# Patient Record
Sex: Male | Born: 1960
Health system: Southern US, Community
[De-identification: ages and names within clinical notes are randomized; demographics above are authoritative.]

## PROBLEM LIST (undated history)

## (undated) DIAGNOSIS — E7800 Pure hypercholesterolemia, unspecified: Secondary | ICD-10-CM

## (undated) DIAGNOSIS — I10 Essential (primary) hypertension: Secondary | ICD-10-CM

## (undated) DIAGNOSIS — E119 Type 2 diabetes mellitus without complications: Secondary | ICD-10-CM

## (undated) HISTORY — PX: BACK SURGERY: SHX140

---

## 2000-09-28 ENCOUNTER — Emergency Department (HOSPITAL_COMMUNITY): Admission: EM | Admit: 2000-09-28 | Discharge: 2000-09-29 | Payer: Self-pay | Admitting: Emergency Medicine

## 2002-07-05 ENCOUNTER — Emergency Department (HOSPITAL_COMMUNITY): Admission: EM | Admit: 2002-07-05 | Discharge: 2002-07-06 | Payer: Self-pay | Admitting: Emergency Medicine

## 2004-10-30 ENCOUNTER — Ambulatory Visit (HOSPITAL_COMMUNITY): Admission: RE | Admit: 2004-10-30 | Discharge: 2004-10-30 | Payer: Self-pay | Admitting: Family Medicine

## 2005-06-27 ENCOUNTER — Emergency Department (HOSPITAL_COMMUNITY): Admission: EM | Admit: 2005-06-27 | Discharge: 2005-06-27 | Payer: Self-pay | Admitting: Emergency Medicine

## 2007-03-31 ENCOUNTER — Ambulatory Visit (HOSPITAL_COMMUNITY): Admission: RE | Admit: 2007-03-31 | Discharge: 2007-03-31 | Payer: Self-pay | Admitting: *Deleted

## 2007-04-14 ENCOUNTER — Ambulatory Visit (HOSPITAL_COMMUNITY): Admission: RE | Admit: 2007-04-14 | Discharge: 2007-04-14 | Payer: Self-pay | Admitting: *Deleted

## 2011-02-25 ENCOUNTER — Other Ambulatory Visit: Payer: Self-pay

## 2011-02-25 ENCOUNTER — Telehealth: Payer: Self-pay

## 2011-02-25 DIAGNOSIS — Z139 Encounter for screening, unspecified: Secondary | ICD-10-CM

## 2011-02-25 NOTE — Telephone Encounter (Signed)
CONTINUE GLUCOPHAGE.  MOVI PREP SPLIT DOSING, REGULAR BREAKFAST. CLEAR LIQUIDS AFTER 9 AM.

## 2011-02-25 NOTE — Telephone Encounter (Signed)
Gastroenterology Pre-Procedure Form   Request Date: 02/25/2011        Requesting Physician: Dr. Sherwood Gambler     PATIENT INFORMATION:  Scott Griffin is a 51 y.o., male (DOB=[birth year]).  PROCEDURE: Procedure(s) requested: colonoscopy Procedure Reason: screening for colon cancer  PATIENT REVIEW QUESTIONS: The patient reports the following:   1. Diabetes Melitis: yes  2. Joint replacements in the past 12 months: no 3. Major health problems in the past 3 months: no 4. Has an artificial valve or MVP:no 5. Has been advised in past to take antibiotics in advance of a procedure like teeth cleaning: no}    MEDICATIONS & ALLERGIES:    Patient reports the following regarding taking any blood thinners:   Plavix? no Aspirin?yes  Coumadin?  no  Patient confirms/reports the following medications:  Current Outpatient Prescriptions  Medication Sig Dispense Refill  . amLODipine (NORVASC) 5 MG tablet Take 5 mg by mouth daily.      Marland Kitchen aspirin 325 MG tablet Take 325 mg by mouth daily.      . metFORMIN (GLUCOPHAGE) 500 MG tablet Take 500 mg by mouth 2 (two) times daily with a meal. Takes two tablets twice daily        Patient confirms/reports the following allergies:  No Known Allergies  Patient is appropriate to schedule for requested procedure(s): yes  AUTHORIZATION INFORMATION Primary Insurance:   ID #:   Group #:  Pre-Cert / Auth required:  Pre-Cert / Auth #:   Secondary Insurance:   ID #:   Group #:  Pre-Cert / Auth required Pre-Cert / Auth #:   No orders of the defined types were placed in this encounter.    SCHEDULE INFORMATION: Procedure has been scheduled as follows:  Date: 03/16/2011       Time: 8:15 AM  Location: [facility] Short Stay  This Gastroenterology Pre-Precedure Form is being routed to the following provider(s) for review: Jonette Eva, MD

## 2011-02-25 NOTE — Telephone Encounter (Signed)
LM for pt to call

## 2011-02-26 NOTE — Telephone Encounter (Signed)
Rx and instructions mailed to pt.  

## 2011-03-11 ENCOUNTER — Encounter (HOSPITAL_COMMUNITY): Payer: Self-pay | Admitting: Pharmacy Technician

## 2011-03-13 MED ORDER — SODIUM CHLORIDE 0.45 % IV SOLN: Freq: Once | INTRAVENOUS | Status: DC

## 2011-03-16 ENCOUNTER — Telehealth: Payer: Self-pay | Admitting: Gastroenterology

## 2011-03-16 NOTE — Telephone Encounter (Signed)
Pt called to reschedule his TCS-it was originally scheduled for 01/28- he asked to move it to 02/05- I called Kim Faint and left a message in regards to this

## 2011-03-24 ENCOUNTER — Ambulatory Visit (HOSPITAL_COMMUNITY)
Admission: RE | Admit: 2011-03-24 | Payer: No Typology Code available for payment source | Source: Ambulatory Visit | Admitting: Gastroenterology

## 2011-03-24 ENCOUNTER — Telehealth: Payer: Self-pay | Admitting: Gastroenterology

## 2011-03-24 ENCOUNTER — Encounter: Payer: Self-pay | Admitting: Gastroenterology

## 2011-03-24 ENCOUNTER — Encounter (HOSPITAL_COMMUNITY): Admission: RE | Payer: Self-pay | Source: Ambulatory Visit

## 2011-03-24 SURGERY — COLONOSCOPY
Anesthesia: Moderate Sedation

## 2011-03-24 NOTE — Telephone Encounter (Signed)
Call pt. He has failed to show for his TCS twice. He will need to be seen in the office to discuss his procedure prior to rescheduling.

## 2011-03-24 NOTE — Telephone Encounter (Signed)
Called multiple times, busy signal, will mail letter

## 2012-02-21 ENCOUNTER — Emergency Department (HOSPITAL_COMMUNITY): Payer: No Typology Code available for payment source

## 2012-02-21 ENCOUNTER — Encounter (HOSPITAL_COMMUNITY): Payer: Self-pay | Admitting: *Deleted

## 2012-02-21 ENCOUNTER — Other Ambulatory Visit: Payer: Self-pay

## 2012-02-21 ENCOUNTER — Emergency Department (HOSPITAL_COMMUNITY)
Admission: EM | Admit: 2012-02-21 | Discharge: 2012-02-21 | Disposition: A | Discharge status: Home / Self Care | Payer: No Typology Code available for payment source | Attending: Emergency Medicine | Admitting: Emergency Medicine

## 2012-02-21 DIAGNOSIS — R079 Chest pain, unspecified: Secondary | ICD-10-CM | POA: Insufficient documentation

## 2012-02-21 DIAGNOSIS — Z7982 Long term (current) use of aspirin: Secondary | ICD-10-CM | POA: Insufficient documentation

## 2012-02-21 DIAGNOSIS — E119 Type 2 diabetes mellitus without complications: Secondary | ICD-10-CM | POA: Insufficient documentation

## 2012-02-21 DIAGNOSIS — F17200 Nicotine dependence, unspecified, uncomplicated: Secondary | ICD-10-CM | POA: Insufficient documentation

## 2012-02-21 DIAGNOSIS — Z794 Long term (current) use of insulin: Secondary | ICD-10-CM | POA: Insufficient documentation

## 2012-02-21 DIAGNOSIS — I10 Essential (primary) hypertension: Secondary | ICD-10-CM | POA: Insufficient documentation

## 2012-02-21 DIAGNOSIS — E7800 Pure hypercholesterolemia, unspecified: Secondary | ICD-10-CM | POA: Insufficient documentation

## 2012-02-21 DIAGNOSIS — Z79899 Other long term (current) drug therapy: Secondary | ICD-10-CM | POA: Insufficient documentation

## 2012-02-21 HISTORY — DX: Type 2 diabetes mellitus without complications: E11.9

## 2012-02-21 HISTORY — DX: Pure hypercholesterolemia, unspecified: E78.00

## 2012-02-21 HISTORY — DX: Essential (primary) hypertension: I10

## 2012-02-21 LAB — CBC
HCT: 45.7 % (ref 39.0–52.0)
MCV: 82.8 fL (ref 78.0–100.0)
RBC: 5.52 MIL/uL (ref 4.22–5.81)
RDW: 13.3 % (ref 11.5–15.5)
WBC: 8.3 10*3/uL (ref 4.0–10.5)

## 2012-02-21 LAB — BASIC METABOLIC PANEL
BUN: 11 mg/dL (ref 6–23)
CO2: 25 mEq/L (ref 19–32)
Chloride: 100 mEq/L (ref 96–112)
Creatinine, Ser: 1.03 mg/dL (ref 0.50–1.35)
Glucose, Bld: 144 mg/dL — ABNORMAL HIGH (ref 70–99)

## 2012-02-21 NOTE — ED Notes (Signed)
Left sided cp x 2 wks with intermittent sob and n/v only in mornings.  Reports feels heart flutter at times.  NSR on ECG in triage.

## 2012-02-21 NOTE — ED Provider Notes (Signed)
History     CSN: [id number]  Arrival date & time 02/21/12  1123   First MD Initiated Contact with Patient 02/21/12 1329      Chief Complaint  Patient presents with  . Chest Pain    (Consider location/radiation/quality/duration/timing/severity/associated sxs/prior treatment) Patient is a 52 y.o. male presenting with chest pain. The history is provided by the patient (the pt complains of mild chest pain off and on. ). No language interpreter was used.  Chest Pain The chest pain began less than 1 hour ago. Chest pain occurs frequently. The chest pain is unchanged. Associated with: nothing. At its most intense, the pain is at 4/10. The pain is currently at 0/10. The severity of the pain is moderate. The quality of the pain is described as aching. The pain does not radiate. Exacerbated by: nothing. Pertinent negatives for primary symptoms include no fatigue, no cough and no abdominal pain.  Pertinent negatives for past medical history include no seizures.     Past Medical History  Diagnosis Date  . Diabetes mellitus without complication   . Hypertension   . High cholesterol     Past Surgical History  Procedure Date  . Back surgery     No family history on file.  History  Substance Use Topics  . Smoking status: Current Every Day Smoker    Types: Cigarettes  . Smokeless tobacco: Not on file  . Alcohol Use: No      Review of Systems  Constitutional: Negative for fatigue.  HENT: Negative for congestion, sinus pressure and ear discharge.   Eyes: Negative for discharge.  Respiratory: Negative for cough.   Cardiovascular: Positive for chest pain.  Gastrointestinal: Negative for abdominal pain and diarrhea.  Genitourinary: Negative for frequency and hematuria.  Musculoskeletal: Negative for back pain.  Skin: Negative for rash.  Neurological: Negative for seizures and headaches.  Hematological: Negative.   Psychiatric/Behavioral: Negative for hallucinations.    Allergies   Review of patient's allergies indicates no known allergies.  Home Medications   Current Outpatient Rx  Name  Route  Sig  Dispense  Refill  . AMLODIPINE BESYLATE 5 MG PO TABS   Oral   Take 5 mg by mouth daily.         . ASPIRIN 325 MG PO TABS   Oral   Take 325 mg by mouth daily.         Marland Kitchen BENAZEPRIL HCL 40 MG PO TABS   Oral   Take 40 mg by mouth daily.         . INSULIN GLARGINE 100 UNIT/ML [place] SOLN   Subcutaneous   Inject 20 Units into the skin at bedtime.         Marland Kitchen METFORMIN HCL 500 MG PO TABS   Oral   Take 1,000 mg by mouth 2 (two) times daily with a meal. Takes two tablets twice daily         . SIMVASTATIN 20 MG PO TABS   Oral   Take 20 mg by mouth every evening.           BP 121/79  Pulse 79  Temp 98.1 F (36.7 C) (Oral)  Resp 21  Ht 5\' 8"  (1.727 m)  Wt 208 lb (94.348 kg)  BMI 31.63 kg/m2  SpO2 97%  Physical Exam  Constitutional: He is oriented to person, place, and time. He appears well-developed.  HENT:  Head: Normocephalic and atraumatic.  Eyes: Conjunctivae normal and EOM are normal. No scleral icterus.  Neck: Neck supple. No thyromegaly present.  Cardiovascular: Normal rate and regular rhythm.  Exam reveals no gallop and no friction rub.   No murmur heard. Pulmonary/Chest: No stridor. He has no wheezes. He has no rales. He exhibits no tenderness.  Abdominal: He exhibits no distension. There is no tenderness. There is no rebound.  Musculoskeletal: Normal range of motion. He exhibits no edema.  Lymphadenopathy:    He has no cervical adenopathy.  Neurological: He is oriented to person, place, and time. Coordination normal.  Skin: No rash noted. No erythema.  Psychiatric: He has a normal mood and affect. His behavior is normal.    ED Course  Procedures (including critical care time)  Labs Reviewed  BASIC METABOLIC PANEL - Abnormal; Notable for the following:    Sodium 134 (*)     Glucose, Bld 144 (*)     GFR calc non Af Amer 81 (*)      All other components within normal limits  CBC  TROPONIN I  TROPONIN I   Dg Chest Port 1 View  02/21/2012  *RADIOLOGY REPORT*  Clinical Data: Chest pain  PORTABLE CHEST - 1 VIEW  Comparison: 04/14/2007  Findings: Lung volumes are low with crowding of the bronchovascular markings and bibasilar linear opacities, likely atelectasis.  No pleural effusion.  Heart size is normal.  IMPRESSION: Low volumes with linear bibasilar opacities, likely atelectasis. This could obscure early pneumonia. If the patient's symptoms continue, consider PA and lateral chest radiographs obtained at full inspiration when the patient is clinically able.   Original Report Authenticated By: Christiana Pellant, M.D.      1. Chest pain     Date: 02/21/2012  Rate:79  Rhythm: normal sinus rhythm  QRS Axis: normal  Intervals: normal  ST/T Wave abnormalities: normal  Conduction Disutrbances:none  Narrative Interpretation:   Old EKG Reviewed: none available     MDM          Benny Lennert, MD 02/21/12 [phone]

## 2013-04-20 DIAGNOSIS — I10 Essential (primary) hypertension: Secondary | ICD-10-CM | POA: Diagnosis not present

## 2013-04-20 DIAGNOSIS — Z125 Encounter for screening for malignant neoplasm of prostate: Secondary | ICD-10-CM | POA: Diagnosis not present

## 2013-04-20 DIAGNOSIS — Z6829 Body mass index (BMI) 29.0-29.9, adult: Secondary | ICD-10-CM | POA: Diagnosis not present

## 2013-04-20 DIAGNOSIS — IMO0001 Reserved for inherently not codable concepts without codable children: Secondary | ICD-10-CM | POA: Diagnosis not present

## 2013-04-20 DIAGNOSIS — E119 Type 2 diabetes mellitus without complications: Secondary | ICD-10-CM | POA: Diagnosis not present

## 2013-04-20 DIAGNOSIS — E785 Hyperlipidemia, unspecified: Secondary | ICD-10-CM | POA: Diagnosis not present

## 2013-04-20 DIAGNOSIS — Z719 Counseling, unspecified: Secondary | ICD-10-CM | POA: Diagnosis not present

## 2014-01-05 ENCOUNTER — Emergency Department (HOSPITAL_COMMUNITY): Payer: Medicare Other

## 2014-01-05 ENCOUNTER — Emergency Department (HOSPITAL_COMMUNITY)
Admission: EM | Admit: 2014-01-05 | Discharge: 2014-01-05 | Disposition: A | Discharge status: Home / Self Care | Payer: Medicare Other | Attending: Emergency Medicine | Admitting: Emergency Medicine

## 2014-01-05 ENCOUNTER — Encounter (HOSPITAL_COMMUNITY): Payer: Self-pay | Admitting: *Deleted

## 2014-01-05 DIAGNOSIS — I10 Essential (primary) hypertension: Secondary | ICD-10-CM | POA: Diagnosis not present

## 2014-01-05 DIAGNOSIS — Y998 Other external cause status: Secondary | ICD-10-CM | POA: Diagnosis not present

## 2014-01-05 DIAGNOSIS — E119 Type 2 diabetes mellitus without complications: Secondary | ICD-10-CM | POA: Insufficient documentation

## 2014-01-05 DIAGNOSIS — W11XXXA Fall on and from ladder, initial encounter: Secondary | ICD-10-CM | POA: Diagnosis not present

## 2014-01-05 DIAGNOSIS — R202 Paresthesia of skin: Secondary | ICD-10-CM

## 2014-01-05 DIAGNOSIS — S62032A Displaced fracture of proximal third of navicular [scaphoid] bone of left wrist, initial encounter for closed fracture: Secondary | ICD-10-CM | POA: Insufficient documentation

## 2014-01-05 DIAGNOSIS — S128XXA Fracture of other parts of neck, initial encounter: Secondary | ICD-10-CM | POA: Diagnosis not present

## 2014-01-05 DIAGNOSIS — W19XXXA Unspecified fall, initial encounter: Secondary | ICD-10-CM

## 2014-01-05 DIAGNOSIS — M542 Cervicalgia: Secondary | ICD-10-CM | POA: Diagnosis not present

## 2014-01-05 DIAGNOSIS — M546 Pain in thoracic spine: Secondary | ICD-10-CM | POA: Diagnosis not present

## 2014-01-05 DIAGNOSIS — S62002A Unspecified fracture of navicular [scaphoid] bone of left wrist, initial encounter for closed fracture: Secondary | ICD-10-CM

## 2014-01-05 DIAGNOSIS — S199XXA Unspecified injury of neck, initial encounter: Secondary | ICD-10-CM | POA: Diagnosis not present

## 2014-01-05 DIAGNOSIS — S3992XA Unspecified injury of lower back, initial encounter: Secondary | ICD-10-CM | POA: Diagnosis not present

## 2014-01-05 DIAGNOSIS — Y9389 Activity, other specified: Secondary | ICD-10-CM | POA: Diagnosis not present

## 2014-01-05 DIAGNOSIS — Y929 Unspecified place or not applicable: Secondary | ICD-10-CM | POA: Insufficient documentation

## 2014-01-05 DIAGNOSIS — Z23 Encounter for immunization: Secondary | ICD-10-CM | POA: Diagnosis not present

## 2014-01-05 DIAGNOSIS — Z720 Tobacco use: Secondary | ICD-10-CM | POA: Diagnosis not present

## 2014-01-05 DIAGNOSIS — M25532 Pain in left wrist: Secondary | ICD-10-CM

## 2014-01-05 DIAGNOSIS — S01511A Laceration without foreign body of lip, initial encounter: Secondary | ICD-10-CM

## 2014-01-05 DIAGNOSIS — S3991XA Unspecified injury of abdomen, initial encounter: Secondary | ICD-10-CM | POA: Diagnosis not present

## 2014-01-05 DIAGNOSIS — S299XXA Unspecified injury of thorax, initial encounter: Secondary | ICD-10-CM | POA: Diagnosis not present

## 2014-01-05 DIAGNOSIS — S6992XA Unspecified injury of left wrist, hand and finger(s), initial encounter: Secondary | ICD-10-CM | POA: Insufficient documentation

## 2014-01-05 DIAGNOSIS — S0990XA Unspecified injury of head, initial encounter: Secondary | ICD-10-CM | POA: Diagnosis not present

## 2014-01-05 DIAGNOSIS — R079 Chest pain, unspecified: Secondary | ICD-10-CM

## 2014-01-05 DIAGNOSIS — S0993XA Unspecified injury of face, initial encounter: Secondary | ICD-10-CM | POA: Diagnosis not present

## 2014-01-05 DIAGNOSIS — S24109A Unspecified injury at unspecified level of thoracic spinal cord, initial encounter: Secondary | ICD-10-CM | POA: Diagnosis not present

## 2014-01-05 DIAGNOSIS — S3993XA Unspecified injury of pelvis, initial encounter: Secondary | ICD-10-CM | POA: Diagnosis not present

## 2014-01-05 LAB — CBC WITH DIFFERENTIAL/PLATELET
BASOS ABS: 0 10*3/uL (ref 0.0–0.1)
Basophils Relative: 0 % (ref 0–1)
EOS PCT: 1 % (ref 0–5)
Eosinophils Absolute: 0.1 10*3/uL (ref 0.0–0.7)
HCT: 46 % (ref 39.0–52.0)
Hemoglobin: 15.7 g/dL (ref 13.0–17.0)
LYMPHS PCT: 24 % (ref 12–46)
Lymphs Abs: 2.2 10*3/uL (ref 0.7–4.0)
MCH: 28.6 pg (ref 26.0–34.0)
MCHC: 34.1 g/dL (ref 30.0–36.0)
MCV: 83.8 fL (ref 78.0–100.0)
Monocytes Absolute: 0.7 10*3/uL (ref 0.1–1.0)
Monocytes Relative: 8 % (ref 3–12)
NEUTROS ABS: 6.1 10*3/uL (ref 1.7–7.7)
NEUTROS PCT: 67 % (ref 43–77)
PLATELETS: 200 10*3/uL (ref 150–400)
RBC: 5.49 MIL/uL (ref 4.22–5.81)
RDW: 13 % (ref 11.5–15.5)
WBC: 9.2 10*3/uL (ref 4.0–10.5)

## 2014-01-05 LAB — I-STAT CHEM 8, ED
BUN: 7 mg/dL (ref 6–23)
CALCIUM ION: 1.17 mmol/L (ref 1.12–1.23)
Chloride: 104 mEq/L (ref 96–112)
Creatinine, Ser: 0.9 mg/dL (ref 0.50–1.35)
Glucose, Bld: 153 mg/dL — ABNORMAL HIGH (ref 70–99)
HEMATOCRIT: 50 % (ref 39.0–52.0)
HEMOGLOBIN: 17 g/dL (ref 13.0–17.0)
Potassium: 3.9 mEq/L (ref 3.7–5.3)
SODIUM: 138 meq/L (ref 137–147)
TCO2: 22 mmol/L (ref 0–100)

## 2014-01-05 MED ORDER — FENTANYL CITRATE 0.05 MG/ML IJ SOLN
25.0000 ug | INTRAMUSCULAR | Status: AC | PRN
  Administered 2014-01-05 (×2): 25 ug via INTRAVENOUS
  Filled 2014-01-05 (×2): qty 2

## 2014-01-05 MED ORDER — METHYLPREDNISOLONE 4 MG PO KIT: PACK | ORAL | Status: DC

## 2014-01-05 MED ORDER — IOHEXOL 300 MG/ML  SOLN
100.0000 mL | Freq: Once | INTRAMUSCULAR | Status: AC | PRN
  Administered 2014-01-05: 100 mL via INTRAVENOUS

## 2014-01-05 MED ORDER — FENTANYL CITRATE 0.05 MG/ML IJ SOLN
100.0000 ug | INTRAMUSCULAR | Status: AC | PRN
  Administered 2014-01-05 (×2): 100 ug via INTRAVENOUS
  Filled 2014-01-05 (×2): qty 2

## 2014-01-05 MED ORDER — SODIUM CHLORIDE 0.9 % IV SOLN
INTRAVENOUS | Status: DC
  Administered 2014-01-05: 12:00:00 via INTRAVENOUS

## 2014-01-05 MED ORDER — POVIDONE-IODINE 10 % EX SOLN
CUTANEOUS | Status: AC
  Filled 2014-01-05: qty 118

## 2014-01-05 MED ORDER — TETANUS-DIPHTH-ACELL PERTUSSIS 5-2.5-18.5 LF-MCG/0.5 IM SUSP
0.5000 mL | Freq: Once | INTRAMUSCULAR | Status: AC
  Administered 2014-01-05: 0.5 mL via INTRAMUSCULAR
  Filled 2014-01-05: qty 0.5

## 2014-01-05 MED ORDER — METHOCARBAMOL 500 MG PO TABS: 1000.0000 mg | ORAL_TABLET | Freq: Four times a day (QID) | ORAL | Status: DC | PRN

## 2014-01-05 MED ORDER — LIDOCAINE HCL (PF) 2 % IJ SOLN
INTRAMUSCULAR | Status: AC
  Filled 2014-01-05: qty 10

## 2014-01-05 MED ORDER — OXYCODONE-ACETAMINOPHEN 5-325 MG PO TABS: ORAL_TABLET | ORAL | Status: DC

## 2014-01-05 NOTE — Discharge Instructions (Signed)
°Emergency Department Resource Guide °1) Find a Doctor and Pay Out of Pocket °Although you won't have to find out who is covered by your insurance plan, it is a good idea to ask around and get recommendations. You will then need to call the office and see if the doctor you have chosen will accept you as a new patient and what types of options they offer for patients who are self-pay. Some doctors offer discounts or will set up payment plans for their patients who do not have insurance, but you will need to ask so you aren't surprised when you get to your appointment. ° °2) Contact Your Local Health Department °Not all health departments have doctors that can see patients for sick visits, but many do, so it is worth a call to see if yours does. If you don't know where your local health department is, you can check in your phone book. The CDC also has a tool to help you locate your state's health department, and many state websites also have listings of all of their local health departments. ° °3) Find a Walk-in Clinic °If your illness is not likely to be very severe or complicated, you may want to try a walk in clinic. These are popping up all over the country in pharmacies, drugstores, and shopping centers. They're usually staffed by nurse practitioners or physician assistants that have been trained to treat common illnesses and complaints. They're usually fairly quick and inexpensive. However, if you have serious medical issues or chronic medical problems, these are probably not your best option. ° °No Primary Care Doctor: °- Call Health Connect at  832-8000 - they can help you locate a primary care doctor that  accepts your insurance, provides certain services, etc. °- Physician Referral Service- 1-800-533-3463 ° °Chronic Pain Problems: °Organization         Address  Phone   Notes  °[facility]  (336) 297-2271 Patients need to be referred by their primary care doctor.  ° °Medication  Assistance: °Organization         Address  Phone   Notes  °[place] [facility] [address] °[place], [place] [address] (336) 641-8030 --Must be a resident of [place] °-- Must have NO insurance coverage whatsoever (no Medicaid/ Medicare, etc.) °-- The pt. MUST have a primary care doctor that directs their care regularly and follows them in the community °  °[facility]  (866) 331-1348   °[employer]  (888) 892-1162   ° °Agencies that provide inexpensive medical care: °Organization         Address  Phone   Notes  °[facility]  (336) 832-8035   °[facility]    (336) 832-7272   °[facility] [address] °[place], [place] [address] (336) 832-4777   °[facility] [address], °[place] (336) 271-4999   °[facility]    (336) 373-0678   °[facility]    (336) 272-1050   °[facility] ° [address], [place] Phone:  (336) 832-4444, Fax:  (336) 832-4440 Hours of Operation:  9 am - 6 pm, M-F.  Also accepts Medicaid/Medicare and self-pay.  °[facility] ° [address], [place] Phone: (336) 832-3150, Fax: (336) [phone]. Hours of Operation:  8:30 am - 5:30 pm, M-F.  Also accepts Medicaid and self-pay.  °[facility] [place] [address], [place] Phone: (336) 878-6027   °[facility] [address], [place], [place] (336)723-1848, Ext. 123 Mondays & Thursdays: 7-9 AM.  First 15 patients are seen on a first come, first serve basis. °  ° °Medicaid-accepting [place] Providers: ° °Organization         Address  Phone   Notes  °[facility] [address], [place] (336) 641-2100 Also accepts self-pay patients.  °[facility] [address], [place] ° (336) 856-9996   °[facility] [address], [place]  (336) 288-8857   °[facility] [address] [place] [address], [place] (336) 299-7000   °Veita Bland [address], [place]  ° (336) 373-1557 Only accepts [place] Access Medicaid patients after they have their name applied to their card.  ° °Self-Pay (no insurance) in [place]: ° °Organization         Address  Phone   Notes  °Sickle Cell Patients, [facility] [address], [place] (336) 832-1970   °[facility] [address], [place] (336) 832-4400   °[facility] ° [address], [place] (336) 992-4800   °[facility]/Dr. Osei-Bonsu ° [address] [place] [address], [place] or [address], [place] (336) 841-8500 Phone number for both [place] and [place] locations is the same.  °[facility] [address], [place] (336) 299-0000   °[facility] [address] [place] [address], [place] or [address] (336) 852-7530 °(336) 878-2260   °[facility] [address], [place] (336) 350-1642, phone; (336) 294-5005, fax Sees patients 1st and 3rd Saturday of every month.  Must not qualify for public or private insurance (i.e. Medicaid, Medicare, [place] Health Choice, Veterans' Benefits) • Household income should be no more than 200% of the poverty level •The clinic cannot treat you if you are pregnant or think you are pregnant • Sexually transmitted diseases are not treated at the clinic.  ° ° °Dental Care: °Organization         Address  Phone  Notes  °[place] [facility] [address], [place] (336) 641-6152 Accepts children up to age 21 who are enrolled in Medicaid or [place] Health Choice; pregnant women with a Medicaid card; and children who have applied for Medicaid or [place] Health Choice, but were declined, whose parents can pay a reduced fee at time of service.  °[place]  [facility] [place]  [address], [place] (336) 641-7733 Accepts children up to age 21 who are enrolled in Medicaid or [place] Health Choice; pregnant women with a Medicaid card; and children who have applied for Medicaid or [place] Health Choice, but were declined, whose parents can pay a reduced fee at time of service.  °[facility] ° [address], [place] (336) 641-4533 Patients are seen by appointment only. Walk-ins are not accepted. [facility] will see patients 18 years of age and older. °Monday - Tuesday (8am-5pm) °Most Wednesdays (8:30-5pm) °$30 per visit, cash only  °[facility] ° [address], [place] (336) 641-4533 Patients are seen by appointment only. Walk-ins are not accepted. [facility] will see patients 18 years of age and older. °One   Wednesday Evening (Monthly: Volunteer Based).  $30 per visit, cash only  °[facility]  (919) 537-3737 for adults; Children under age 4, call Graduate Pediatric Dentistry at (919) [phone]. Children aged 4-14, please call (919) [phone] to request a pediatric application. ° Dental services are provided in all areas of dental care including fillings, crowns and bridges, complete and partial dentures, implants, gum treatment, root canals, and extractions. Preventive care is also provided. Treatment is provided to both adults and children. °Patients are selected via a lottery and there is often a waiting list. °  °[facility] [address], °[place] ° (336) 763-8833 [email or web address] °  °[facility] [address], [place], [place] (336)723-1848, Ext. 123 Second and Fourth Thursday of each month, opens at 6:30 AM; Clinic ends at 9 AM.  Patients are seen on a first-come first-served basis, and a limited number are seen during each clinic.  ° °[facility] ° [address], [place], [place] (336) 723-7904    Eligibility Requirements °You must have lived in [place], [place], or [place] counties for at least the last three months. °  You cannot be eligible for state or federal sponsored healthcare insurance, including Veterans Administration, Medicaid, or Medicare. °  You generally cannot be eligible for healthcare insurance through your employer.  °  How to apply: °Eligibility screenings are held every Tuesday and Wednesday afternoon from 1:00 pm until 4:00 pm. You do not need an appointment for the interview!  °[facility] [address], [place], [place] 336-631-2330   °[facility]  336-342-8273   °[facility]  336-703-3100   °[facility]  336-570-6415   ° °Behavioral Health Resources in the Community: °Intensive Outpatient Programs °Organization         Address  Phone  Notes  °[facility] [address], [place], [place] 336-878-6098   °[facility] [address], [place], [place] 336-832-9800   °[facility] [address], [place], [place] ° 336-882-2125   °[facility] [address],  °[place], [place] 1-800-853-5163 or 336-641-4981   °Substance Abuse Resources °Organization         Address  Phone  Notes  °[facility]  336-882-2125   °[facility]  336-784-9470   °[facility]  336-285-9073   °[facility]  336-845-3988   °Residential & Outpatient Substance Abuse Program  1-800-659-3381   °Psychological Services °Organization         Address  Phone  Notes  °[facility]  336- 832-9600   °[facility]  336- 378-7881   °[facility] [address], [place] 1-800-853-5163 or 336-641-4981   ° °Mobile Crisis Teams °Organization         Address  Phone  Notes  °[facility], Mobile Crisis Care Unit  1-877-626-1772   °Assertive °[facility] ° [address]  [place], [place] 336-834-9664   °Sharon DeEsch [address] °[place] 336-554-5454   ° °Self-Help/Support Groups °Organization         Address  Phone             Notes  °[facility] of  - variety of support groups  336- 373-1402 Call for more information  °Narcotics Anonymous (NA), [facility] [address], °[place]  2 meetings at this location  ° °  Residential Treatment Programs Organization         Address  Phone  Notes  [facility] [address],    [place]  [phone]   [facility]  [address], [place] [phone], [place], [place] [phone]   [facility] [address], [place] [phone](564) [phone] Admissions: 8am-3pm M-F  [facility] [address]    [place], [place] [phone]   [facility] [address], [place], [place] [phone]   [facility] [address]  [place], [place] [phone](938)[phone]   [facility] Intensive Outpatient [address], [place], [place] [phone]   [facility] ([facility] [address]  [place], [place] [phone] or [phone]   [facility] ([facility]) [address] [place], [place] [phone] Accepts Medicaid  [facility] [address]  [place] [phone] Substance Abuse/Addiction Treatment   [facility] Organization         Address  Phone  Notes  [facility]  (661) 635-8330(888) [phone]   Angie FavaJulie Brannon, PhD [address] [place], [place]   (640)[phone](336) [phone] or [phone](336) [phone]   [facility]   [address] [place], [place] [phone](336) (337) 828-8300   [facility] [address], [place], [place] [phone](336) [phone] Insurance/Medicaid/sponsorship through [facility]  [facility] [address]                                    [place], [place] [phone](336) [phone] Therapy/tele-psych/case    [facility] [address]   [place], [place] [phone](336) [phone]    Dr. Lolly MustacheArfeen  [phone](336) [phone]   [facility] [place]  [employer] [facility] 1) [address], [place] 2) [address], [place] 3)  [address], [place] [phone](336) [phone]) [phone](336) [phone]  [phone](336) (747) [phone]   [facility] Child Abuse Hotline 229-189-4726(336) (873)230-2878 or 252-808-7398(336) 250-350-1759 (After Hours)       Take the prescriptions as directed. The stitches in your mouth will dissolve on their own. Wear the wrist splint at all times and use the sling as needed for comfort until you are seen in follow up by the Orthopedic doctor. Wear the cervical collar at all times until you are seen in follow up by the Neurosurgeon. Apply moist heat or ice to the area(s) of discomfort, for 15 minutes at a time, several times per day for the next few days.  Do not fall asleep on a heating or ice pack.  Call your regular medical doctor on Monday to schedule a follow up appointment in the next 3 days. Call the Neurosurgeon on Monday to schedule a follow up appointment within the next week. Call the Orthopedic doctor on Monday to schedule a follow up appointment within the next week. Return to the Emergency Department immediately if worsening.

## 2014-01-05 NOTE — ED Provider Notes (Signed)
CSN: [id number]     Arrival date & time 01/05/14  1106 History   First MD Initiated Contact with Patient 01/05/14 1151     Chief Complaint  Patient presents with  . Fall      HPI Pt was seen at 1155. Per pt and his family, c/o sudden onset and resolution of one episode of slip and fall off a 6 foot ladder this morning PTA. Pt states he was getting down from the roof after cleaning the gutters when the ladder became off balance and he fell face first onto the ground. Pt states he landed with the bottom half of his body on the porch and the top half of his body on the ground, face down. Pt states he initially felt "tingling" of his bilat UE's and LLE, so he "just laid there a while." Pt states his LLE tingling improved and he was able to stand on his own and walk into the house. Pt states his leg continues improved but his UE's continue to "tingle." Pt also c/o neck pain and lower lip laceration. Pt states his neck pain worsens when he flexes his arms forward. Denies low back pain, no CP/SOB, no abd pain, no focal motor weakness, no syncope/LOC, no AMS.    Past Medical History  Diagnosis Date  . Diabetes mellitus without complication   . Hypertension   . High cholesterol    Past Surgical History  Procedure Laterality Date  . Back surgery      History  Substance Use Topics  . Smoking status: Current Every Day Smoker    Types: Cigarettes  . Smokeless tobacco: Not on file  . Alcohol Use: No    Review of Systems ROS: Statement: All systems negative except as marked or noted in the HPI; Constitutional: Negative for fever and chills. ; ; Eyes: Negative for eye pain, redness and discharge. ; ; ENMT: Negative for ear pain, hoarseness, nasal congestion, sinus pressure and sore throat. ; ; Cardiovascular: Negative for chest pain, palpitations, diaphoresis, dyspnea and peripheral edema. ; ; Respiratory: Negative for cough, wheezing and stridor. ; ; Gastrointestinal: Negative for nausea,  vomiting, diarrhea, abdominal pain, blood in stool, hematemesis, jaundice and rectal bleeding. ; ; Genitourinary: Negative for dysuria, flank pain and hematuria. ; ; Musculoskeletal: +neck pain. Negative for back pain. Negative for swelling and deformity.; ; Skin: +laceration. Negative for pruritus, rash, abrasions, blisters, bruising and skin lesion.; ; Neuro: +paresthesias. Negative for headache, lightheadedness and neck stiffness. Negative for weakness, altered level of consciousness , altered mental status, extremity weakness, involuntary movement, seizure and syncope.      Allergies  Review of patient's allergies indicates no known allergies.  Home Medications   Prior to Admission medications   Medication Sig Start Date End Date Taking? Authorizing Provider  amLODipine (NORVASC) 5 MG tablet Take 5 mg by mouth daily.   Yes Historical Provider, MD  aspirin 325 MG tablet Take 325 mg by mouth daily.   Yes Historical Provider, MD  benazepril (LOTENSIN) 40 MG tablet Take 40 mg by mouth daily.   Yes Historical Provider, MD  Insulin Detemir (LEVEMIR FLEXPEN) 100 UNIT/ML Pen Inject 20 Units into the skin daily at 10 pm.   Yes Historical Provider, MD  metFORMIN (GLUCOPHAGE) 500 MG tablet Take 1,000 mg by mouth 2 (two) times daily with a meal. Takes two tablets twice daily   Yes Historical Provider, MD   BP 135/92 mmHg  Pulse 77  Temp(Src) 98.6 F (37 C) (Oral)  Resp 18  Ht 5\' 8"  (1.727 m)  Wt 208 lb (94.348 kg)  BMI 31.63 kg/m2  SpO2 98% Physical Exam  1200: Physical examination: Vital signs and O2 SAT: Reviewed; Constitutional: Well developed, Well nourished, Well hydrated, In no acute distress; Head and Face: Normocephalic, Atraumatic; Eyes: EOMI, PERRL, No scleral icterus; ENMT: Mouth and pharynx normal, Left TM normal, Right TM normal, Mucous membranes moist. +inner right lower lip lac, hemostatic, not through and through, no obvious FB. Lower teeth intact, edentulous upper. Mouth and  pharynx without lesions. No tonsillar exudates. No intra-oral edema. No submandibular or sublingual edema. No hoarse voice, no drooling, no stridor. No trismus.;; Neck: Trachea midline. No abrasions or ecchymosis.; Spine: +midline CS TTP. No midline TS, LS tenderness. +TTP bilat cervical paraspinal and bilat hypertonic trapezius muscles.  No abrasions or ecchymosis.; Cardiovascular: Regular rate and rhythm, No gallop; Respiratory: Breath sounds clear & equal bilaterally, No wheezes, Normal respiratory effort/excursion; Chest: Nontender, No deformity, Movement normal, No crepitus, No abrasions or ecchymosis.; Abdomen: Soft, Nontender, Nondistended, Normal bowel sounds, No abrasions or ecchymosis.; Genitourinary: No CVA tenderness;; Extremities: No deformity, Full range of motion major/large joints of bilat UE's and LE's without pain or tenderness to palp, Neurovascularly intact, Pulses normal, No tenderness, No edema, Pelvis stable. NT to palp entire bilat UE's. Motor strength at shoulder normal.  Bilateral hands having intact and equal strength in the distribution of the median, radial, and ulnar nerve function compared to opposite side.  Strong radial pulse.; Neuro: AA&Ox3, GCS 15.  Major CN grossly intact. No facial droop. Speech clear. Grips equal Strength 5/5 equal bilat UE's and LE's. No drift x4 extremities. +subjective but equal "different" sensation bilat UE's.; Skin: Color normal, Warm, Dry.     ED Course  Procedures   1200:  Pt arrived to ED by POV. C-collar applied by Triage RN given pt's c/o bilat UE's paresthesias. Neuro exam with bilat UE's subjectively "feeling funny" but pt unable to qualify further. No motor deficit, though pt does c/o increasing pain with bilat shoulder flexion. Will maintain c-collar while workup progresses.   1500:  Lip laceration closed by midlevel. CT CS with central soft disc protrusion at C4-5; MRI CS pending. C-collar maintained. No change in neuro exam. Pt has  climbed off the stretcher on his own and is walking around his exam room, gait steady, easy resps, NAD.   1540:  Pt now c/o left wrist pain:  Localized mild edema and mild generalized TTP on exam, no specific area of tenderness, no abrasions/ecchymosis/deformity. NT left shoulder/elbow/hand/fingers. RUE NT. Will XR wrist.  1605:  T/C to [facility] Dr. Margo AyeHall to discuss MRI: he further clarified fluid in parapharyngeal space likely from fx hyoid bone. Dx and testing d/w pt and family.  Questions answered.  Verb understanding. Pt now states: "well yeah, my throat is a little sore when I swallow."    1615:  T/C to Neurosurgery Dr. Danielle DessElsner, case discussed, including:  HPI, pertinent PM/SHx, VS/PE, dx testing, ED course and treatment:  Agrees with ED treatment, no acute neurosurgical condition at this time, appears to be "a stinger" at this time, keep aspen collar on, rx steroid dose pack, f/u in ofc in the next 1 to 2 weeks.   1645:  [facility] Emeline DarlingGore, case discussed, including:  HPI, pertinent PM/SHx, VS/PE, dx testing, ED course and treatment:  No acute intervention is needed at this time for hyoid bone fx. Pt has gotten himself dressed, is walking around  his exam room, and wants to go home now. Dx and testing, as well as d/w Neurosurgeon and ENT MD's, d/w pt and family.  Questions answered.  Verb understanding, agreeable to d/c home with outpt f/u.     MDM  MDM Reviewed: previous chart, nursing note and vitals Reviewed previous: labs Interpretation: labs, CT scan and x-ray     Results for orders placed or performed during the hospital encounter of 01/05/14  CBC with Differential  Result Value Ref Range   WBC 9.2 4.0 - 10.5 K/uL   RBC 5.49 4.22 - 5.81 MIL/uL   Hemoglobin 15.7 13.0 - 17.0 g/dL   HCT 16.1 09.6 - 04.5 %   MCV 83.8 78.0 - 100.0 fL   MCH 28.6 26.0 - 34.0 pg   MCHC 34.1 30.0 - 36.0 g/dL   RDW 40.9 81.1 - 91.4 %   Platelets 200 150 - 400 K/uL   Neutrophils Relative % 67 43 - 77 %    Neutro Abs 6.1 1.7 - 7.7 K/uL   Lymphocytes Relative 24 12 - 46 %   Lymphs Abs 2.2 0.7 - 4.0 K/uL   Monocytes Relative 8 3 - 12 %   Monocytes Absolute 0.7 0.1 - 1.0 K/uL   Eosinophils Relative 1 0 - 5 %   Eosinophils Absolute 0.1 0.0 - 0.7 K/uL   Basophils Relative 0 0 - 1 %   Basophils Absolute 0.0 0.0 - 0.1 K/uL  I-stat chem 8, ed  Result Value Ref Range   Sodium 138 137 - 147 mEq/L   Potassium 3.9 3.7 - 5.3 mEq/L   Chloride 104 96 - 112 mEq/L   BUN 7 6 - 23 mg/dL   Creatinine, Ser 7.82 0.50 - 1.35 mg/dL   Glucose, Bld 956 (H) 70 - 99 mg/dL   Calcium, Ion 2.13 0.86 - 1.23 mmol/L   TCO2 22 0 - 100 mmol/L   Hemoglobin 17.0 13.0 - 17.0 g/dL   HCT 57.8 46.9 - 62.9 %   Ct Head Wo Contrast 01/05/2014   CLINICAL DATA:  The patient fell from a ladder today and has extensive trauma including face, posterior neck pain, bilateral shoulder pain, and pins in needles pain radiating down both arms and hands.  EXAM: CT HEAD WITHOUT CONTRAST  CT MAXILLOFACIAL WITHOUT CONTRAST  CT CERVICAL SPINE WITHOUT CONTRAST  TECHNIQUE: Multidetector CT imaging of the head, cervical spine, and maxillofacial structures were performed using the standard protocol without intravenous contrast. Multiplanar CT image reconstructions of the cervical spine and maxillofacial structures were also generated.  COMPARISON:  None.  FINDINGS: CT HEAD FINDINGS  No mass lesion. No midline shift. No acute hemorrhage or hematoma. No extra-axial fluid collections. No evidence of acute infarction. Brain parenchyma is normal. Osseous structures are normal.  CT MAXILLOFACIAL FINDINGS  There is no fracture. There are multiple missing teeth and broken teeth and numerous dental caries. The paranasal sinuses are clear.  CT CERVICAL SPINE FINDINGS  There is no fracture or subluxation or prevertebral soft tissue swelling. No facet arthritis. There is slight disc space narrowing at C4-5 with a central soft disc protrusion which compresses the thecal  sac and probably slightly compresses the spinal cord.  No other abnormalities.  IMPRESSION: 1. No acute abnormalities of the head or maxillofacial structures. 2. Central soft disc protrusion at C4-5 which compresses the thecal sac and probably slightly compresses the spinal cord. 3. In no cervical spine fracture or subluxation.   Electronically Signed   By: Rosanne Ashing  Maxwell M.D.   On: 01/05/2014 14:21   Ct Chest W Contrast 01/05/2014   CLINICAL DATA:  Attn lumbar spine Attn lspine and tspine per dr Clarene Duke, Fall from 53ft ladder today, face first onto ground, rest of body landed on porch, c/o posterior neck pain with bilat shoulder pain, pin and needles pain radiating down bila.*comment was truncated*  EXAM: CT CHEST, ABDOMEN, AND PELVIS WITH CONTRAST  TECHNIQUE: Multidetector CT imaging of the chest, abdomen and pelvis was performed following the standard protocol during bolus administration of intravenous contrast.  CONTRAST:  OMNIPAQUE IOHEXOL 300 MG/ML  SOLN  COMPARISON:  None.  FINDINGS: CT CHEST FINDINGS  Lungs are clear. Airways are normal. Heart is normal. Minimal calcified plaque over the left anterior descending coronary artery. Mild calcified plaque over the aortic arch. Calcified subcarinal lymph node is present. Remaining mediastinal structures are normal. Bones and soft tissues are normal.  CT ABDOMEN AND PELVIS FINDINGS  The liver, spleen, pancreas, gallbladder and adrenal glands are normal. Kidneys are normal. There is no free fluid or free peritoneal air. There is minimal calcified plaque over the distal abdominal aorta and iliac arteries. Appendix is normal. Bowel is within normal.  Pelvic images demonstrate the bladder, prostate and rectum to be normal. There are mild degenerative changes of the spine. There is moderate disc disease at the L4-5 level. There are mild degenerative changes of the hips. There is no definite acute fracture.  IMPRESSION: No acute findings in the chest, abdomen or  pelvis.  Minimal atherosclerotic coronary artery disease.  Mild age and changes of the spine and hips. Disc disease at the L4-5 level.   Electronically Signed   By: Elberta Fortis M.D.   On: 01/05/2014 14:22   Ct Cervical Spine Wo Contrast 01/05/2014   CLINICAL DATA:  The patient fell from a ladder today and has extensive trauma including face, posterior neck pain, bilateral shoulder pain, and pins in needles pain radiating down both arms and hands.  EXAM: CT HEAD WITHOUT CONTRAST  CT MAXILLOFACIAL WITHOUT CONTRAST  CT CERVICAL SPINE WITHOUT CONTRAST  TECHNIQUE: Multidetector CT imaging of the head, cervical spine, and maxillofacial structures were performed using the standard protocol without intravenous contrast. Multiplanar CT image reconstructions of the cervical spine and maxillofacial structures were also generated.  COMPARISON:  None.  FINDINGS: CT HEAD FINDINGS  No mass lesion. No midline shift. No acute hemorrhage or hematoma. No extra-axial fluid collections. No evidence of acute infarction. Brain parenchyma is normal. Osseous structures are normal.  CT MAXILLOFACIAL FINDINGS  There is no fracture. There are multiple missing teeth and broken teeth and numerous dental caries. The paranasal sinuses are clear.  CT CERVICAL SPINE FINDINGS  There is no fracture or subluxation or prevertebral soft tissue swelling. No facet arthritis. There is slight disc space narrowing at C4-5 with a central soft disc protrusion which compresses the thecal sac and probably slightly compresses the spinal cord.  No other abnormalities.  IMPRESSION: 1. No acute abnormalities of the head or maxillofacial structures. 2. Central soft disc protrusion at C4-5 which compresses the thecal sac and probably slightly compresses the spinal cord. 3. In no cervical spine fracture or subluxation.   Electronically Signed   By: Geanie Cooley M.D.   On: 01/05/2014 14:21   Ct Abdomen Pelvis W Contrast 01/05/2014   CLINICAL DATA:  Attn lumbar  spine Attn lspine and tspine per dr Clarene Duke, Fall from 79ft ladder today, face first onto ground, rest of body landed  on porch, c/o posterior neck pain with bilat shoulder pain, pin and needles pain radiating down bila.*comment was truncated*  EXAM: CT CHEST, ABDOMEN, AND PELVIS WITH CONTRAST  TECHNIQUE: Multidetector CT imaging of the chest, abdomen and pelvis was performed following the standard protocol during bolus administration of intravenous contrast.  CONTRAST:  100mL OMNIPAQUE IOHEXOL 300 MG/ML  SOLN  COMPARISON:  None.  FINDINGS: CT CHEST FINDINGS  Lungs are clear. Airways are normal. Heart is normal. Minimal calcified plaque over the left anterior descending coronary artery. Mild calcified plaque over the aortic arch. Calcified subcarinal lymph node is present. Remaining mediastinal structures are normal. Bones and soft tissues are normal.  CT ABDOMEN AND PELVIS FINDINGS  The liver, spleen, pancreas, gallbladder and adrenal glands are normal. Kidneys are normal. There is no free fluid or free peritoneal air. There is minimal calcified plaque over the distal abdominal aorta and iliac arteries. Appendix is normal. Bowel is within normal.  Pelvic images demonstrate the bladder, prostate and rectum to be normal. There are mild degenerative changes of the spine. There is moderate disc disease at the L4-5 level. There are mild degenerative changes of the hips. There is no definite acute fracture.  IMPRESSION: No acute findings in the chest, abdomen or pelvis.  Minimal atherosclerotic coronary artery disease.  Mild age and changes of the spine and hips. Disc disease at the L4-5 level.   Electronically Signed   By: Elberta Fortisaniel  Boyle M.D.   On: 01/05/2014 14:22   Dg Chest Portable 1 View 01/05/2014   CLINICAL DATA:  Initial encounter for 6 foot fall from ladder landing face down on ground. Tingling in both hands and arms. Paresthesias.  EXAM: PORTABLE CHEST - 1 VIEW  COMPARISON:  None.  FINDINGS: 1209 hrs. Lungs are  clear bilaterally without evidence for pneumothorax or pleural effusion. The cardiopericardial silhouette is within normal limits for size. Imaged bony structures of the thorax are intact.  IMPRESSION: No acute cardiopulmonary findings.   Electronically Signed   By: Kennith CenterEric  Mansell M.D.   On: 01/05/2014 12:23   Ct Maxillofacial Wo Cm 01/05/2014   CLINICAL DATA:  The patient fell from a ladder today and has extensive trauma including face, posterior neck pain, bilateral shoulder pain, and pins in needles pain radiating down both arms and hands.  EXAM: CT HEAD WITHOUT CONTRAST  CT MAXILLOFACIAL WITHOUT CONTRAST  CT CERVICAL SPINE WITHOUT CONTRAST  TECHNIQUE: Multidetector CT imaging of the head, cervical spine, and maxillofacial structures were performed using the standard protocol without intravenous contrast. Multiplanar CT image reconstructions of the cervical spine and maxillofacial structures were also generated.  COMPARISON:  None.  FINDINGS: CT HEAD FINDINGS  No mass lesion. No midline shift. No acute hemorrhage or hematoma. No extra-axial fluid collections. No evidence of acute infarction. Brain parenchyma is normal. Osseous structures are normal.  CT MAXILLOFACIAL FINDINGS  There is no fracture. There are multiple missing teeth and broken teeth and numerous dental caries. The paranasal sinuses are clear.  CT CERVICAL SPINE FINDINGS  There is no fracture or subluxation or prevertebral soft tissue swelling. No facet arthritis. There is slight disc space narrowing at C4-5 with a central soft disc protrusion which compresses the thecal sac and probably slightly compresses the spinal cord.  No other abnormalities.  IMPRESSION: 1. No acute abnormalities of the head or maxillofacial structures. 2. Central soft disc protrusion at C4-5 which compresses the thecal sac and probably slightly compresses the spinal cord. 3. In no cervical spine fracture  or subluxation.   Electronically Signed   By: Geanie Cooley M.D.   On:  01/05/2014 14:21   Ct Thoracic Spine Wo Contrast 01/05/2014   CLINICAL DATA:  Larey Seat 6 foot from ladder landing face first onto ground with rest of body landing on porch, posterior neck and BILATERAL shoulder pain, pins and needles sensation radiating down BILATERAL arms and hands, initial encounter, personal history of diabetes mellitus, hypertension, smoker  EXAM: CT THORACIC SPINE WITHOUT CONTRAST  TECHNIQUE: Multidetector CT imaging of the thoracic spine was performed without intravenous contrast administration. Multiplanar CT image reconstructions were also generated.  COMPARISON:  None.  FINDINGS: Ununited ossification center RIGHT transverse process L1.  Visualized posterior ribs intact.  Vertebral body heights maintained.  Scattered disc space narrowing and minimal endplate spur formation thoracic spine.  No thoracic fracture, subluxation or bone destruction.  No paraspinal hematoma or regional soft tissue abnormalities.  IMPRESSION: Mild degenerative disc disease changes of the thoracic spine.  No acute thoracic vertebral fracture or subluxation.   Electronically Signed   By: Ulyses Southward M.D.   On: 01/05/2014 15:36   Ct Lumbar Spine Wo Contrast 01/05/2014   CLINICAL DATA:  Larey Seat 6 foot from ladder today face first on ground with rest of body landing on porch, posterior neck and BILATERAL shoulder pain, pins and needles radiating down both arms and hands, attention lumbar spine  EXAM: CT LUMBAR SPINE WITHOUT CONTRAST  TECHNIQUE: Multidetector CT imaging of the lumbar spine was performed without intravenous contrast administration. Multiplanar CT image reconstructions were also generated.  COMPARISON:  None  FINDINGS: Twelve rib pairs present on CT thoracic spine  Six non-rib-bearing lumbar type vertebrae labeled as L1 through lumbarized S1.  Ununited ossification center RIGHT transverse process L1.  Paraspinal soft tissues unremarkable.  Disc space narrowing with endplate spur formation and vacuum  phenomenon at L5-S1.  Diffusely bulging disc L5-S1 narrowing AP diameter of spinal canal in combination with ligamentum flavum thickening and facet hypertrophy.  Inferior aspects of neural foramina narrowed bilaterally at L5-S1 without neural compression.  Vertebral body and disc space heights otherwise maintained.  No acute fracture, subluxation, bone destruction or spondylolysis.  Minimal atherosclerotic calcification aorta.  IMPRESSION: Lumbarized S1 segment labeled.  Degenerative disc disease changes at L5-S1.  No acute fracture or subluxation.   Electronically Signed   By: Ulyses Southward M.D.   On: 01/05/2014 15:47   Mr Cervical Spine Wo Contrast 01/05/2014   ADDENDUM REPORT: 01/05/2014 16:05  ADDENDUM: Study discussed by telephone with Dr. Samuel Jester on 01/05/2014 at 1550 hrs. We reviewed the finding of right parapharyngeal fluid, and on correlation with thickened temporary cervical spine CT I note this fluid is associated with a displaced fracture of the posterior tip of the right hyoid bone (series 5, image 48 of the CT cervical spine study).   Electronically Signed   By: Augusto Gamble M.D.   On: 01/05/2014 16:05   01/05/2014   CLINICAL DATA:  53 year old male status post fall from ladder with paresthesia is, neck pain with pain radiating down both arms into the hands. Initial encounter.  EXAM: MRI CERVICAL SPINE WITHOUT CONTRAST  TECHNIQUE: Multiplanar, multisequence MR imaging of the cervical spine was performed. No intravenous contrast was administered.  COMPARISON:  Cervical spine CT 1334 hr today.  FINDINGS: Stable vertebral height and alignment. No marrow edema or evidence of acute osseous abnormality.  There is abnormal fluid signal tracking from the midline of the retropharyngeal space or prevertebral space at  the C3 level laterally and anteriorly to the carotid space. See series 6, image 6, series 7, image 5, series 5, image 5. Otherwise no abnormal prevertebral signal. Posterior cervical spine  ligamentous signal also within normal limits.  Cervicomedullary junction is within normal limits. No abnormal cervical spinal cord signal identified, despite stenosis at C4-C5 detailed below.  Other visible paraspinal soft tissues are within normal limits.  C2-C3:  Negative.  C3-C4: Mild uncovertebral hypertrophy. There is a degree of congenital canal narrowing at this level with mild spinal stenosis. Minimal to mild bilateral C4 foraminal stenosis.  C4-C5: Disc space loss. Broad-based disc protrusion best seen on series 6, image 14. Underlying congenital canal narrowing. Spinal stenosis with flattening of the ventral spinal cord. Uncovertebral hypertrophy with moderate bilateral C5 foraminal stenosis.  C5-C6: Congenital canal narrowing, borderline to mild spinal stenosis overall. Uncovertebral hypertrophy and mild facet hypertrophy on the left. Moderate left and mild right C6 foraminal stenosis.  C6-C7: Congenital canal narrowing abates at this level. Mild disc bulge and ligament flavum hypertrophy. No significant spinal stenosis. Mild left C7 foraminal stenosis in part due to mild facet hypertrophy.  C7-T1: Minor disc bulge. Mild facet hypertrophy. No significant stenosis.  No upper thoracic spinal stenosis.  IMPRESSION: 1. C4-C5 disc degeneration with broad-based disc herniation contributing to spinal stenosis with mild spinal cord mass effect, but no cord signal abnormality. Moderate bilateral C5, and moderate left C6 degenerative foraminal stenosis. 2. No definite cervical spine ligamentous injury. There is acute soft tissue injury with edema tracking into the right parapharyngeal space from the C3 level, perhaps this is related to the patient's facial trauma.  Electronically Signed: By: Augusto Gamble M.D. On: 01/05/2014 15:43    Dg Wrist Complete Left 01/05/2014   CLINICAL DATA:  53 year old male with fall from ladder. Left wrist pain and swelling. Pain is reported to beyond the radial aspect.  EXAM: LEFT  WRIST - COMPLETE 3+ VIEW  COMPARISON:  None.  FINDINGS: No acute fracture of the distal radius or ulna. Minimal degenerative changes of the distal radioulnar joint.  Alignment of the carpal bones maintained.  There is a lucent line traversing the scaphoid, which is present on three views and is concerning for a fracture line.  No radiopaque foreign bodies.  Degenerative changes at the first carpometacarpal joint.  IMPRESSION: Lucent line traversing the scaphoid, concerning for fracture, particularly if the patient has pain in the anatomic snuffbox. Further evaluation with MRI may be considered, if warranted.  No acute abnormality of the distal radius or ulna.  Signed,  Yvone Neu. Loreta Ave, DO  [facility]  [facility]   Electronically Signed   By: Gilmer Mor D.O.   On: 01/05/2014 16:00       Samuel Jester, DO 01/08/14 [phone]

## 2014-01-05 NOTE — ED Notes (Signed)
Patient c/o L wrist swelling.  There is mild swelling.  Patient states he fell on this wrist.  Applied ice pack.

## 2014-01-05 NOTE — ED Notes (Signed)
Patient with no complaints at this time. Respirations even and unlabored. Skin warm/dry. Discharge instructions reviewed with patient at this time. Patient given opportunity to voice concerns/ask questions. IV removed per policy and band-aid applied to site. Patient discharged at this time and left Emergency Department with steady gait.  

## 2014-01-05 NOTE — ED Notes (Signed)
Patient ambulated to BR.

## 2014-01-05 NOTE — ED Notes (Signed)
Patient fell face forward off 6 foot ladder from his porch.  He landed with his face hitting the ground and his lower body up on the porch which is 3 steps higher.  Complains of cervical and bilateral shoulder pain w/pins and needles sensation into arms down to hands.  Also has same sensation in L leg.  Sensation is intact.  He is able to raise legs off bed w/out pain.  Minimal arm raise causes severe pain in neck and shoulders.  Arm flexion against resistance causes more pain than extension against resistance.

## 2014-01-05 NOTE — ED Notes (Addendum)
Fell app 6 ft  From ladder,  No Loc alert, talking  Pain both arms.  Lt leg,  Struck face on ground,  Neck pain. Tingling in both arms.  Swelling of his lip with lac.  Placed in C collar

## 2014-01-05 NOTE — ED Provider Notes (Signed)
LACERATION REPAIR OF LOWER LIP  Patient identified by arm band. Permission for the procedure is given by the patient. Procedural time out taken. The patient is noted to have a laceration involving the mucosal area of the lower lip. The procedure was explained to the patient in terms which he understands, knees in agreement with this procedure.  The area was cleansed with saline. There was local infiltration with 1% plain lidocaine, with good anesthetic. Following this the wound was irrigated again, there was no debris appreciated. After further cleansing it was noted that the patient has a avulsion type laceration involving the mucosal area of the mid left lower lip. The area was repaired with 3 interrupted sutures of 4-0 chromic gut. There was good wound edge anastomosis. Wound measures 2.3cm.  Bleeding was controlled. The patient tolerated the procedure without problem.  Discussed with the patient that this was an avulsion laceration. Discussed the possibility of scarring on the inner aspect of the lip. The patient acknowledges understanding of this healing process and possible risk.  Kathie DikeHobson M Read Bonelli, PA-C 01/05/14 1611  Samuel JesterKathleen McManus, DO 01/08/14 [phone]

## 2014-01-08 ENCOUNTER — Telehealth: Payer: Self-pay | Admitting: Orthopedic Surgery

## 2014-01-08 NOTE — Telephone Encounter (Signed)
NOTED, TRIED TO CALL LMOM FOR PATIENT TO CALL BACK ASAP

## 2014-01-08 NOTE — Telephone Encounter (Signed)
Patient returned call and he is scheduled on 11/24 per Dr. HRexene Edison

## 2014-01-08 NOTE — Telephone Encounter (Signed)
tues Bea Laura/tomorrow

## 2014-01-08 NOTE — Telephone Encounter (Signed)
Dr. Romeo AppleHarrison could you please look at this chart and advise how quickly this patient should be seen here in the office? He was in the ER on 01/05/14 and is c/o left wrist

## 2014-01-09 ENCOUNTER — Encounter: Payer: Self-pay | Admitting: Orthopedic Surgery

## 2014-01-09 ENCOUNTER — Ambulatory Visit (INDEPENDENT_AMBULATORY_CARE_PROVIDER_SITE_OTHER): Payer: Medicare Other | Admitting: Orthopedic Surgery

## 2014-01-09 VITALS — BP 160/117 | Ht 68.0 in | Wt 208.0 lb

## 2014-01-09 DIAGNOSIS — S62002A Unspecified fracture of navicular [scaphoid] bone of left wrist, initial encounter for closed fracture: Secondary | ICD-10-CM

## 2014-01-09 MED ORDER — OXYCODONE-ACETAMINOPHEN 5-325 MG PO TABS: 1.0000 | ORAL_TABLET | ORAL | Status: DC | PRN

## 2014-01-09 NOTE — Patient Instructions (Addendum)
Will schedule CT of wrist for this week

## 2014-01-09 NOTE — Progress Notes (Signed)
Patient ID: Scott Griffin, male   DOB: Jan 18, 1961, 53 y.o.   MRN: [id number]  Chief Complaint  Patient presents with  . Wrist Injury    Left wrist fracture, DOI 01-05-14.    I will off of a ladder. Date of injury 01/05/2014  Pain swelling numbness tingling left thumb. Right-hand-dominant. Pain 6 out of 10. X-rays have been reviewed and there is most likely a waist scaphoid fracture. Patient will be sent for CT scan.  No allergies  Family history of diabetes hypertension stress arthritis  Social history one pack per day smoker occasional alcohol use currently disabled no street drugs are used. History of back surgery 1997  Medical history of hypertension arthritis and diabetes  Current medications are reviewed and include benazepril amlodipine metformin Levemir methocarbamol oxycodone the methylprednisolone  Review of systems weight loss or ringing of the ears redness of the eyes cough all other systems were negative  BP 160/117 mmHg  Ht 5\' 8"  (1.727 m)  Wt 208 lb (94.348 kg)  BMI 31.63 kg/m2 He is awake alert and oriented 3 mood and affect is normal he is of normal appearance in stature. Ambulatory status noncontributory  Left wrist tenderness over the scaphoid and distal radius with painful range of motion and painful scaphoid tubercle. Claudette LawsWatson test could not be performed no instability noted at the wrist joint. Muscle tone is normal scans intact pulses are good lymph nodes are negative sensation is normal.  X-ray interpretation waist scaphoid fracture  Recommend CT scan without contrast. Patient placed in Ryan oh splint  Patient given prescription for 42 Percocet 1 every 4 when necessary for pain dose 5/325  Follow-up on Monday to review CT scan.

## 2014-01-10 ENCOUNTER — Ambulatory Visit (HOSPITAL_COMMUNITY)
Admission: RE | Admit: 2014-01-10 | Discharge: 2014-01-10 | Disposition: A | Discharge status: Home / Self Care | Payer: Medicare Other | Source: Ambulatory Visit | Attending: Orthopedic Surgery | Admitting: Orthopedic Surgery

## 2014-01-10 DIAGNOSIS — S62032A Displaced fracture of proximal third of navicular [scaphoid] bone of left wrist, initial encounter for closed fracture: Secondary | ICD-10-CM | POA: Insufficient documentation

## 2014-01-10 DIAGNOSIS — S62002A Unspecified fracture of navicular [scaphoid] bone of left wrist, initial encounter for closed fracture: Secondary | ICD-10-CM | POA: Diagnosis present

## 2014-01-10 DIAGNOSIS — X58XXXA Exposure to other specified factors, initial encounter: Secondary | ICD-10-CM | POA: Insufficient documentation

## 2014-01-10 DIAGNOSIS — M19032 Primary osteoarthritis, left wrist: Secondary | ICD-10-CM | POA: Diagnosis not present

## 2014-01-15 ENCOUNTER — Encounter: Payer: Self-pay | Admitting: Orthopedic Surgery

## 2014-01-15 ENCOUNTER — Ambulatory Visit (INDEPENDENT_AMBULATORY_CARE_PROVIDER_SITE_OTHER): Payer: Self-pay | Admitting: Orthopedic Surgery

## 2014-01-15 ENCOUNTER — Other Ambulatory Visit: Payer: Self-pay | Admitting: *Deleted

## 2014-01-15 VITALS — BP 136/90 | Ht 68.0 in | Wt 208.0 lb

## 2014-01-15 DIAGNOSIS — S62002D Unspecified fracture of navicular [scaphoid] bone of left wrist, subsequent encounter for fracture with routine healing: Secondary | ICD-10-CM

## 2014-01-15 DIAGNOSIS — S62002S Unspecified fracture of navicular [scaphoid] bone of left wrist, sequela: Secondary | ICD-10-CM

## 2014-01-15 NOTE — Progress Notes (Signed)
Patient ID: Scott Griffin, male   DOB: 22-Sep-1960, 53 y.o.   MRN: [id number] Chief Complaint  Patient presents with  . Follow-up    review Ct results    Mr. Scott Griffin comes in to review the CT results of his left wrist for the scaphoid fracture seen on previous x-ray. Date of injury 01/05/2014 is right-hand dominant and injured his scaphoid has medical history of hypertension arthritis and diabetes  Review of systems no new problems to report.  My independent interpretation of the CT scan:  His CT scan is reviewed and I interpreted this is a scaphoid fracture with rotational malalignment  CT report repeat reads comminuted scaphoid fracture throughout the proximal and middle parts of the scaphoid bone with several small fragments which are displaced with 2 mm displacement at the mid distal junction best seen on coronal image  The patient is continued in his thumb spica splint and has an appointment arranged today with Dr. Roda ShuttersXu

## 2014-01-15 NOTE — Patient Instructions (Signed)
Referred to Dr Roda ShuttersXu, appointment Tues. Dec 1 @ 9:15am

## 2014-01-16 DIAGNOSIS — S62015A Nondisplaced fracture of distal pole of navicular [scaphoid] bone of left wrist, initial encounter for closed fracture: Secondary | ICD-10-CM | POA: Diagnosis not present

## 2014-02-27 DIAGNOSIS — S62015D Nondisplaced fracture of distal pole of navicular [scaphoid] bone of left wrist, subsequent encounter for fracture with routine healing: Secondary | ICD-10-CM | POA: Diagnosis not present

## 2014-02-28 DIAGNOSIS — W11XXXA Fall on and from ladder, initial encounter: Secondary | ICD-10-CM | POA: Diagnosis not present

## 2014-02-28 DIAGNOSIS — Z6829 Body mass index (BMI) 29.0-29.9, adult: Secondary | ICD-10-CM | POA: Diagnosis not present

## 2014-02-28 DIAGNOSIS — M545 Low back pain: Secondary | ICD-10-CM | POA: Diagnosis not present

## 2014-04-03 ENCOUNTER — Other Ambulatory Visit: Payer: Self-pay | Admitting: Orthopaedic Surgery

## 2014-04-03 ENCOUNTER — Ambulatory Visit
Admission: RE | Admit: 2014-04-03 | Discharge: 2014-04-03 | Disposition: A | Discharge status: Home / Self Care | Payer: Medicare Other | Source: Ambulatory Visit | Attending: Orthopaedic Surgery | Admitting: Orthopaedic Surgery

## 2014-04-03 DIAGNOSIS — M25532 Pain in left wrist: Secondary | ICD-10-CM

## 2014-04-03 DIAGNOSIS — S62015S Nondisplaced fracture of distal pole of navicular [scaphoid] bone of left wrist, sequela: Secondary | ICD-10-CM | POA: Diagnosis not present

## 2014-04-03 DIAGNOSIS — S62002A Unspecified fracture of navicular [scaphoid] bone of left wrist, initial encounter for closed fracture: Secondary | ICD-10-CM | POA: Diagnosis not present

## 2014-04-10 DIAGNOSIS — R739 Hyperglycemia, unspecified: Secondary | ICD-10-CM | POA: Diagnosis not present

## 2014-04-10 DIAGNOSIS — S62022K Displaced fracture of middle third of navicular [scaphoid] bone of left wrist, subsequent encounter for fracture with nonunion: Secondary | ICD-10-CM | POA: Diagnosis not present

## 2014-04-13 ENCOUNTER — Other Ambulatory Visit (HOSPITAL_BASED_OUTPATIENT_CLINIC_OR_DEPARTMENT_OTHER): Payer: Self-pay | Admitting: Orthopaedic Surgery

## 2014-04-23 ENCOUNTER — Encounter (HOSPITAL_BASED_OUTPATIENT_CLINIC_OR_DEPARTMENT_OTHER): Payer: Self-pay | Admitting: *Deleted

## 2014-04-23 NOTE — Progress Notes (Signed)
   04/23/14 1036  OBSTRUCTIVE SLEEP APNEA  Have you ever been diagnosed with sleep apnea through a sleep study? No  Do you snore loudly (loud enough to be heard through closed doors)?  1  Do you often feel tired, fatigued, or sleepy during the daytime? 0  Has anyone observed you stop breathing during your sleep? 0  Do you have, or are you being treated for high blood pressure? 1  BMI more than 35 kg/m2? 0  Age over 56 years old? 1  Gender: 1

## 2014-04-23 NOTE — Progress Notes (Signed)
To to go to [facility] for ekg-bmet-to bring all meds and overnight bag in case he has to stay-doing iliac crest bone graft

## 2014-04-24 ENCOUNTER — Other Ambulatory Visit: Payer: Self-pay

## 2014-04-24 ENCOUNTER — Encounter (HOSPITAL_BASED_OUTPATIENT_CLINIC_OR_DEPARTMENT_OTHER): Payer: Self-pay | Admitting: Anesthesiology

## 2014-04-24 ENCOUNTER — Encounter (HOSPITAL_COMMUNITY)
Admission: RE | Admit: 2014-04-24 | Discharge: 2014-04-24 | Disposition: A | Discharge status: Home / Self Care | Payer: Medicare Other | Source: Ambulatory Visit | Attending: Orthopaedic Surgery | Admitting: Orthopaedic Surgery

## 2014-04-24 DIAGNOSIS — Z01818 Encounter for other preprocedural examination: Secondary | ICD-10-CM | POA: Diagnosis not present

## 2014-04-24 DIAGNOSIS — M8468XA Pathological fracture in other disease, other site, initial encounter for fracture: Secondary | ICD-10-CM | POA: Diagnosis not present

## 2014-04-24 DIAGNOSIS — Z01812 Encounter for preprocedural laboratory examination: Secondary | ICD-10-CM | POA: Insufficient documentation

## 2014-04-24 LAB — BASIC METABOLIC PANEL
ANION GAP: 10 (ref 5–15)
BUN: 9 mg/dL (ref 6–23)
CALCIUM: 9.4 mg/dL (ref 8.4–10.5)
CO2: 24 mmol/L (ref 19–32)
Chloride: 104 mmol/L (ref 96–112)
Creatinine, Ser: 0.96 mg/dL (ref 0.50–1.35)
GFR calc Af Amer: >90 mL/min (ref >90)
GLUCOSE: 177 mg/dL — AB (ref 70–99)
POTASSIUM: 3.8 mmol/L (ref 3.5–5.1)
Sodium: 138 mmol/L (ref 135–145)

## 2014-04-24 MED ORDER — SODIUM CHLORIDE 0.9 % IJ SOLN
INTRAMUSCULAR | Status: AC
  Filled 2014-04-24: qty 9

## 2014-04-24 NOTE — Progress Notes (Signed)
[facility] reviewed with Dr Krista BlueSinger. EKG changes since last EKG 02/2012. Dr Krista BlueSinger spoke to Dr Roda ShuttersXu about patient needing cardiac workup prior to surgery. Dr Warren DanesXu's office to call patient regarding this. Case cancelled for 04/25/2014.

## 2014-04-25 ENCOUNTER — Ambulatory Visit (HOSPITAL_BASED_OUTPATIENT_CLINIC_OR_DEPARTMENT_OTHER): Admission: RE | Admit: 2014-04-25 | Payer: Medicare Other | Source: Ambulatory Visit | Admitting: Orthopaedic Surgery

## 2014-04-25 SURGERY — OPEN REDUCTION INTERNAL FIXATION (ORIF) SCAPHOID WITH ILIAC CREST BONE GRAFT
Anesthesia: Choice | Laterality: Left

## 2014-04-26 ENCOUNTER — Encounter: Payer: Self-pay | Admitting: Cardiovascular Disease

## 2014-04-26 ENCOUNTER — Ambulatory Visit (INDEPENDENT_AMBULATORY_CARE_PROVIDER_SITE_OTHER): Payer: Medicare Other | Admitting: Cardiovascular Disease

## 2014-04-26 VITALS — BP 134/84 | HR 105 | Ht 68.0 in | Wt 198.0 lb

## 2014-04-26 DIAGNOSIS — R55 Syncope and collapse: Secondary | ICD-10-CM

## 2014-04-26 DIAGNOSIS — R9431 Abnormal electrocardiogram [ECG] [EKG]: Secondary | ICD-10-CM

## 2014-04-26 NOTE — Progress Notes (Signed)
Cardiology Office Note   Date:  04/26/2014   ID:  Scott Griffin, DOB [birth year], MRN [id number]  PCP:  Cassell Smiles., MD  Cardiologist:  Tonny Bollman, MD    Chief Complaint  Patient presents with  . Pre-op Exam    sob     History of Present Illness: Scott Griffin is a 54 y.o. male who presents for  Preoperative cardiac evaluation. The patient sustained a left wrist scaphoid fracture in November 2015 after he fell off the roof. A cast was placed , but there has been a problem with healing. He was initially scheduled for surgery,  But his preoperative EKG was abnormal and surgery was cancelled. He is referred today for preoperative evaluation.   The patient has no history of heart disease. He does have hypertension and  Type 2 diabetes. He was diagnosed with  Diabetes about 7 years ago and he is treated with both insulin and oral hypoglycemics.   He has had some fleeting sharp chest pains, unrelated to physical exertion. They last for 1 or 2 seconds. He's had no exertional chest discomfort , chest pressure , or shortness of breath with activity. Overall he feels pretty well. He is physically active.   he is not sure what caused him to fall at the time of his accident back in November. He doesn't recall the event well, and it is possible that he lost consciousness. He's had one other episode where he became lightheaded and felt like he was close to passing out. This was last year.  Past Medical History  Diagnosis Date  . Diabetes mellitus without complication   . Hypertension   . High cholesterol     Past Surgical History  Procedure Laterality Date  . Back surgery  2952,8413    lumbar disk    Current Outpatient Prescriptions  Medication Sig Dispense Refill  . amLODipine (NORVASC) 5 MG tablet Take 5 mg by mouth daily.    Marland Kitchen aspirin 325 MG tablet Take 325 mg by mouth daily.    . benazepril (LOTENSIN) 40 MG tablet Take 40 mg by mouth daily.    . Insulin Detemir  (LEVEMIR FLEXPEN) 100 UNIT/ML Pen Inject 20 Units into the skin daily at 10 pm.    . metFORMIN (GLUCOPHAGE) 500 MG tablet Take 1,000 mg by mouth 2 (two) times daily with a meal. Takes two tablets twice daily     No current facility-administered medications for this visit.    Allergies:   Review of patient's allergies indicates no known allergies.   Social History:  The patient  reports that he has been smoking Cigarettes.  He does not have any smokeless tobacco history on file. He reports that he does not drink alcohol or use illicit drugs.   Family History:  The patient's   family history includes Diabetes in his father, mother, and sister; Hypertension in his father and mother.    ROS:  Please see the history of present illness.  Otherwise, review of systems is positive for Appetite change, cough, diarrhea, depression, back pain , muscle pain, dizziness , leg pain , snoring , nausea , headaches .  All other systems are reviewed and negative.    PHYSICAL EXAM: VS:  BP 134/84 mmHg  Pulse 105  Ht  (1.727 m)  Wt 198 lb (89.812 kg)  BMI 30.11 kg/m2  SpO2 98% , BMI Body mass index is 30.11 kg/(m^2). GEN: Well nourished, well developed, in no acute distress HEENT: normal  Neck: no JVD, no masses. No carotid bruits Cardiac: RRR without murmur or gallop                Respiratory:  clear to auscultation bilaterally, normal work of breathing GI: soft, nontender, nondistended, + BS MS: no deformity or atrophy Ext: no pretibial edema, pedal pulses 2+= bilaterally , left wrist in a splint Skin: warm and dry, no rash Neuro:  Strength and sensation are intact Psych: euthymic mood, full affect  EKG:  EKG is ordered today. The ekg ordered today shows  Sinus rhythm 97 bpm, within normal limits.   EKG from 04/24/2014 showed a possible age determinant inferior infarct and age indeterminate anterior infarct  Recent Labs: 01/05/2014: Hemoglobin 17.0; Platelets 200 04/24/2014: BUN 9; Creatinine  0.96; Potassium 3.8; Sodium 138   Lipid Panel  No results found for: CHOL, TRIG, HDL, CHOLHDL, VLDL, LDLCALC, LDLDIRECT    Wt Readings from Last 3 Encounters:  04/26/14 198 lb (89.812 kg)  04/23/14 208 lb (94.348 kg)  01/15/14 208 lb (94.348 kg)    ASSESSMENT AND PLAN: 1.   Abnormal EKG: the patient's follow-up EKG in the office is within normal limits. There are borderline Q waves inferiorly, but I suspect this is a normal variant. He has no anginal symptoms.   2. Presyncope / possible syncope. Events that occurred around the time of his injury are bit unclear. Considering his abnormal EKG and questionable episode of syncope, I have recommended a 2D Echocardiogram.   3. Diabetes: managed per PCP  4. HTN: appears to be well-controlled on current Rx  5. Preoperative Evaluation: he is at low-risk of cardiac complications with orthopedic surgery. As long as his echo shows no major abnormalities, I think he can proceed without further testing.    Current medicines are reviewed with the patient today.  The patient does not have concerns regarding medicines.  The following changes have been made:  no change  Labs/ tests ordered today include:  Orders Placed This Encounter  Procedures  . EKG 12-Lead  . 2D Echocardiogram without contrast    Disposition:   FU As needed  Signed, Tonny BollmanMichael Eleri Ruben, MD  04/26/2014 5:38 PM    [facility] [address], [place], [place]  [id number] Phone: [phone]; Fax: [phone]

## 2014-04-26 NOTE — Patient Instructions (Addendum)
Your physician has requested that you have an echocardiogram. Echocardiography is a painless test that uses sound waves to create images of your heart. It provides your doctor with information about the size and shape of your heart and how well your heart's chambers and valves are working. This procedure takes approximately one hour. There are no restrictions for this procedure.  Your physician recommends that you continue on your current medications as directed. Please refer to the Current Medication list given to you today.  Your physician recommends that you schedule a follow-up appointment as needed with Dr Excell Seltzerooper.

## 2014-05-02 ENCOUNTER — Ambulatory Visit (HOSPITAL_COMMUNITY): Payer: Medicare Other | Attending: Cardiovascular Disease | Admitting: Cardiology

## 2014-05-02 DIAGNOSIS — R9431 Abnormal electrocardiogram [ECG] [EKG]: Secondary | ICD-10-CM

## 2014-05-02 DIAGNOSIS — R55 Syncope and collapse: Secondary | ICD-10-CM | POA: Insufficient documentation

## 2014-05-02 NOTE — Progress Notes (Signed)
Echo performed. 

## 2014-05-09 ENCOUNTER — Other Ambulatory Visit (HOSPITAL_BASED_OUTPATIENT_CLINIC_OR_DEPARTMENT_OTHER): Payer: Self-pay | Admitting: Orthopaedic Surgery

## 2014-05-10 ENCOUNTER — Encounter (HOSPITAL_BASED_OUTPATIENT_CLINIC_OR_DEPARTMENT_OTHER): Payer: Self-pay | Admitting: *Deleted

## 2014-05-10 NOTE — Progress Notes (Signed)
Surgery was r/s due to abn ekg-seen by cardiology-echo done-cleared-to briong all meds and overnight bag

## 2014-05-16 ENCOUNTER — Encounter (HOSPITAL_BASED_OUTPATIENT_CLINIC_OR_DEPARTMENT_OTHER): Payer: Self-pay | Admitting: *Deleted

## 2014-05-16 ENCOUNTER — Ambulatory Visit (HOSPITAL_BASED_OUTPATIENT_CLINIC_OR_DEPARTMENT_OTHER): Payer: Medicare Other | Admitting: Certified Registered"

## 2014-05-16 ENCOUNTER — Ambulatory Visit (HOSPITAL_BASED_OUTPATIENT_CLINIC_OR_DEPARTMENT_OTHER)
Admission: RE | Admit: 2014-05-16 | Discharge: 2014-05-16 | Disposition: A | Discharge status: Home / Self Care | Payer: Medicare Other | Source: Ambulatory Visit | Attending: Orthopaedic Surgery | Admitting: Orthopaedic Surgery

## 2014-05-16 ENCOUNTER — Encounter (HOSPITAL_BASED_OUTPATIENT_CLINIC_OR_DEPARTMENT_OTHER)
Admission: RE | Disposition: A | Discharge status: Home / Self Care | Payer: Self-pay | Source: Ambulatory Visit | Attending: Orthopaedic Surgery

## 2014-05-16 DIAGNOSIS — S62015D Nondisplaced fracture of distal pole of navicular [scaphoid] bone of left wrist, subsequent encounter for fracture with routine healing: Secondary | ICD-10-CM | POA: Diagnosis not present

## 2014-05-16 DIAGNOSIS — X58XXXA Exposure to other specified factors, initial encounter: Secondary | ICD-10-CM | POA: Diagnosis not present

## 2014-05-16 DIAGNOSIS — E780 Pure hypercholesterolemia: Secondary | ICD-10-CM | POA: Diagnosis not present

## 2014-05-16 DIAGNOSIS — S62002A Unspecified fracture of navicular [scaphoid] bone of left wrist, initial encounter for closed fracture: Secondary | ICD-10-CM | POA: Diagnosis not present

## 2014-05-16 DIAGNOSIS — Z794 Long term (current) use of insulin: Secondary | ICD-10-CM | POA: Insufficient documentation

## 2014-05-16 DIAGNOSIS — Z7982 Long term (current) use of aspirin: Secondary | ICD-10-CM | POA: Insufficient documentation

## 2014-05-16 DIAGNOSIS — S62002K Unspecified fracture of navicular [scaphoid] bone of left wrist, subsequent encounter for fracture with nonunion: Secondary | ICD-10-CM | POA: Diagnosis not present

## 2014-05-16 DIAGNOSIS — E119 Type 2 diabetes mellitus without complications: Secondary | ICD-10-CM | POA: Insufficient documentation

## 2014-05-16 DIAGNOSIS — F17210 Nicotine dependence, cigarettes, uncomplicated: Secondary | ICD-10-CM | POA: Diagnosis not present

## 2014-05-16 DIAGNOSIS — I10 Essential (primary) hypertension: Secondary | ICD-10-CM | POA: Insufficient documentation

## 2014-05-16 HISTORY — PX: OPEN REDUCTION INTERNAL FIXATION (ORIF) SCAPHOID WITH ILIAC CREST BONE GRAFT: SHX5668

## 2014-05-16 LAB — GLUCOSE, CAPILLARY
GLUCOSE-CAPILLARY: 182 mg/dL — AB (ref 70–99)
Glucose-Capillary: 166 mg/dL — ABNORMAL HIGH (ref 70–99)

## 2014-05-16 LAB — POCT HEMOGLOBIN-HEMACUE: HEMOGLOBIN: 16.2 g/dL (ref 13.0–17.0)

## 2014-05-16 SURGERY — OPEN REDUCTION INTERNAL FIXATION (ORIF) SCAPHOID WITH ILIAC CREST BONE GRAFT
Anesthesia: General | Site: Wrist | Laterality: Left

## 2014-05-16 MED ORDER — ONDANSETRON HCL 4 MG/2ML IJ SOLN
INTRAMUSCULAR | Status: DC | PRN
  Administered 2014-05-16: 4 mg via INTRAVENOUS

## 2014-05-16 MED ORDER — FENTANYL CITRATE 0.05 MG/ML IJ SOLN
INTRAMUSCULAR | Status: AC
  Filled 2014-05-16: qty 2

## 2014-05-16 MED ORDER — PROPOFOL 10 MG/ML IV BOLUS
INTRAVENOUS | Status: DC | PRN
  Administered 2014-05-16: 250 mg via INTRAVENOUS

## 2014-05-16 MED ORDER — OXYCODONE HCL 5 MG PO TABS: 5.0000 mg | ORAL_TABLET | ORAL | Status: DC | PRN

## 2014-05-16 MED ORDER — MIDAZOLAM HCL 2 MG/2ML IJ SOLN
INTRAMUSCULAR | Status: AC
  Filled 2014-05-16: qty 2

## 2014-05-16 MED ORDER — FENTANYL CITRATE 0.05 MG/ML IJ SOLN
INTRAMUSCULAR | Status: AC
  Filled 2014-05-16: qty 6

## 2014-05-16 MED ORDER — LIDOCAINE HCL (CARDIAC) 20 MG/ML IV SOLN
INTRAVENOUS | Status: DC | PRN
  Administered 2014-05-16: 30 mg via INTRAVENOUS

## 2014-05-16 MED ORDER — MIDAZOLAM HCL 2 MG/2ML IJ SOLN: 1.0000 mg | INTRAMUSCULAR | Status: DC | PRN

## 2014-05-16 MED ORDER — HYDROMORPHONE HCL 1 MG/ML IJ SOLN
0.2500 mg | INTRAMUSCULAR | Status: DC | PRN
  Administered 2014-05-16 (×2): 0.5 mg via INTRAVENOUS

## 2014-05-16 MED ORDER — OXYCODONE HCL 5 MG/5ML PO SOLN: 5.0000 mg | Freq: Once | ORAL | Status: AC | PRN

## 2014-05-16 MED ORDER — MIDAZOLAM HCL 5 MG/5ML IJ SOLN
INTRAMUSCULAR | Status: DC | PRN
  Administered 2014-05-16: 2 mg via INTRAVENOUS

## 2014-05-16 MED ORDER — LACTATED RINGERS IV SOLN
INTRAVENOUS | Status: DC
  Administered 2014-05-16 (×2): via INTRAVENOUS

## 2014-05-16 MED ORDER — OXYCODONE HCL 5 MG PO TABS
ORAL_TABLET | ORAL | Status: AC
  Filled 2014-05-16: qty 1

## 2014-05-16 MED ORDER — BUPIVACAINE-EPINEPHRINE 0.5% -1:200000 IJ SOLN
INTRAMUSCULAR | Status: DC | PRN
  Administered 2014-05-16: 10 mL

## 2014-05-16 MED ORDER — OXYCODONE HCL 5 MG PO TABS
5.0000 mg | ORAL_TABLET | Freq: Once | ORAL | Status: AC | PRN
  Administered 2014-05-16: 5 mg via ORAL

## 2014-05-16 MED ORDER — FENTANYL CITRATE 0.05 MG/ML IJ SOLN: 50.0000 ug | INTRAMUSCULAR | Status: DC | PRN

## 2014-05-16 MED ORDER — ONDANSETRON HCL 4 MG/2ML IJ SOLN: 4.0000 mg | Freq: Once | INTRAMUSCULAR | Status: DC | PRN

## 2014-05-16 MED ORDER — DEXAMETHASONE SODIUM PHOSPHATE 10 MG/ML IJ SOLN
INTRAMUSCULAR | Status: DC | PRN
  Administered 2014-05-16: 4 mg via INTRAVENOUS

## 2014-05-16 MED ORDER — HYDROMORPHONE HCL 1 MG/ML IJ SOLN
INTRAMUSCULAR | Status: AC
  Filled 2014-05-16: qty 1

## 2014-05-16 MED ORDER — CEFAZOLIN SODIUM-DEXTROSE 2-3 GM-% IV SOLR
2.0000 g | INTRAVENOUS | Status: AC
  Administered 2014-05-16: 2 g via INTRAVENOUS

## 2014-05-16 MED ORDER — CEFAZOLIN SODIUM-DEXTROSE 2-3 GM-% IV SOLR
INTRAVENOUS | Status: AC
  Filled 2014-05-16: qty 50

## 2014-05-16 MED ORDER — FENTANYL CITRATE 0.05 MG/ML IJ SOLN
INTRAMUSCULAR | Status: DC | PRN
  Administered 2014-05-16 (×2): 25 ug via INTRAVENOUS
  Administered 2014-05-16: 50 ug via INTRAVENOUS
  Administered 2014-05-16 (×5): 25 ug via INTRAVENOUS

## 2014-05-16 SURGICAL SUPPLY — 92 items
BANDAGE ELASTIC 3 VELCRO ST LF (GAUZE/BANDAGES/DRESSINGS) ×3 IMPLANT
BANDAGE ELASTIC 4 VELCRO ST LF (GAUZE/BANDAGES/DRESSINGS) IMPLANT
BIT DRILL MINI LNG ACUTRAK 2 (BIT) ×1 IMPLANT
BLADE AVERAGE 25MMX9MM (BLADE) ×1
BLADE AVERAGE 25X9 (BLADE) ×2 IMPLANT
BLADE CLIPPER SURG (BLADE) ×3 IMPLANT
BLADE MINI RND TIP GREEN BEAV (BLADE) ×6 IMPLANT
BLADE OSC/SAG .038X5.5 CUT EDG (BLADE) ×3 IMPLANT
BLADE SURG 10 STRL SS (BLADE) IMPLANT
BLADE SURG 15 STRL LF DISP TIS (BLADE) ×2 IMPLANT
BLADE SURG 15 STRL SS (BLADE) ×6
BNDG CMPR 9X4 STRL LF SNTH (GAUZE/BANDAGES/DRESSINGS) ×1
BNDG ESMARK 4X9 LF (GAUZE/BANDAGES/DRESSINGS) ×3 IMPLANT
BNDG GAUZE ELAST 4 BULKY (GAUZE/BANDAGES/DRESSINGS) IMPLANT
BRUSH SCRUB EZ PLAIN DRY (MISCELLANEOUS) ×3 IMPLANT
BUR EGG 3PK/BX (BURR) ×3 IMPLANT
CLOSURE STERI-STRIP 1/2X4 (GAUZE/BANDAGES/DRESSINGS) ×1
CLOSURE WOUND 1/4X4 (GAUZE/BANDAGES/DRESSINGS)
CLSR STERI-STRIP ANTIMIC 1/2X4 (GAUZE/BANDAGES/DRESSINGS) ×2 IMPLANT
CORDS BIPOLAR (ELECTRODE) ×3 IMPLANT
COVER BACK TABLE 60X90IN (DRAPES) ×3 IMPLANT
COVER MAYO STAND STRL (DRAPES) ×3 IMPLANT
CUFF TOURNIQUET SINGLE 18IN (TOURNIQUET CUFF) ×3 IMPLANT
DECANTER SPIKE VIAL GLASS SM (MISCELLANEOUS) IMPLANT
DERMABOND ADVANCED (GAUZE/BANDAGES/DRESSINGS) ×2
DERMABOND ADVANCED .7 DNX12 (GAUZE/BANDAGES/DRESSINGS) ×1 IMPLANT
DRAPE C-ARM 42X72 X-RAY (DRAPES) ×3 IMPLANT
DRAPE EXTREMITY T 121X128X90 (DRAPE) ×3 IMPLANT
DRAPE INCISE IOBAN 66X45 STRL (DRAPES) ×3 IMPLANT
DRAPE LAPAROTOMY 100X72 PEDS (DRAPES) ×3 IMPLANT
DRAPE SURG 17X23 STRL (DRAPES) ×3 IMPLANT
DRILL MINI LNG ACUTRAK 2 (BIT) ×3
DRSG MEPILEX BORDER 4X8 (GAUZE/BANDAGES/DRESSINGS) ×3 IMPLANT
DURAPREP 26ML APPLICATOR (WOUND CARE) ×3 IMPLANT
GAUZE SPONGE 4X4 12PLY STRL (GAUZE/BANDAGES/DRESSINGS) ×3 IMPLANT
GAUZE SPONGE 4X4 16PLY XRAY LF (GAUZE/BANDAGES/DRESSINGS) IMPLANT
GAUZE XEROFORM 1X8 LF (GAUZE/BANDAGES/DRESSINGS) ×3 IMPLANT
GLOVE BIOGEL PI IND STRL 6.5 (GLOVE) ×1 IMPLANT
GLOVE BIOGEL PI IND STRL 7.0 (GLOVE) ×1 IMPLANT
GLOVE BIOGEL PI INDICATOR 6.5 (GLOVE) ×2
GLOVE BIOGEL PI INDICATOR 7.0 (GLOVE) ×2
GLOVE NEODERM STRL 7.5 LF PF (GLOVE) ×1 IMPLANT
GLOVE SURG NEODERM 7.5  LF PF (GLOVE) ×2
GLOVE SURG SYN 7.5  E (GLOVE) ×2
GLOVE SURG SYN 7.5 E (GLOVE) ×1 IMPLANT
GOWN PREVENTION PLUS XLARGE (GOWN DISPOSABLE) ×3 IMPLANT
GOWN STRL REIN XL XLG (GOWN DISPOSABLE) ×3 IMPLANT
GUIDEWIRE ORTHO MINI ACTK .045 (WIRE) ×12 IMPLANT
NEEDLE HYPO 22GX1.5 SAFETY (NEEDLE) ×3 IMPLANT
NS IRRIG 1000ML POUR BTL (IV SOLUTION) ×3 IMPLANT
PACK BASIN DAY SURGERY FS (CUSTOM PROCEDURE TRAY) ×3 IMPLANT
PAD CAST 3X4 CTTN HI CHSV (CAST SUPPLIES) ×1 IMPLANT
PADDING CAST ABS 3INX4YD NS (CAST SUPPLIES)
PADDING CAST ABS COTTON 3X4 (CAST SUPPLIES) IMPLANT
PADDING CAST COTTON 3X4 STRL (CAST SUPPLIES) ×3
PADDING CAST SYNTHETIC 3 NS LF (CAST SUPPLIES)
PADDING CAST SYNTHETIC 3X4 NS (CAST SUPPLIES) IMPLANT
PADDING CAST SYNTHETIC 4 (CAST SUPPLIES)
PADDING CAST SYNTHETIC 4X4 STR (CAST SUPPLIES) IMPLANT
SCREW ACUTRAK 2 MINI 22MM (Screw) ×3 IMPLANT
SLEEVE SCD COMPRESS KNEE MED (MISCELLANEOUS) ×3 IMPLANT
SPLINT FIBERGLASS 3X35 (CAST SUPPLIES) IMPLANT
SPLINT PLASTER CAST XFAST 3X15 (CAST SUPPLIES) IMPLANT
SPLINT PLASTER XTRA FASTSET 3X (CAST SUPPLIES)
SPONGE LAP 4X18 X RAY DECT (DISPOSABLE) IMPLANT
STAPLER VISISTAT (STAPLE) IMPLANT
STOCKINETTE 4X48 STRL (DRAPES) ×3 IMPLANT
STRIP CLOSURE SKIN 1/4X4 (GAUZE/BANDAGES/DRESSINGS) IMPLANT
SUCTION FRAZIER TIP 10 FR DISP (SUCTIONS) IMPLANT
SUT BONE WAX W31G (SUTURE) ×3 IMPLANT
SUT ETHILON 3 0 PS 1 (SUTURE) IMPLANT
SUT MERSILENE 4 0 P 3 (SUTURE) IMPLANT
SUT MNCRL AB 4-0 PS2 18 (SUTURE) ×3 IMPLANT
SUT VIC AB 0 CT1 18XCR BRD 8 (SUTURE) IMPLANT
SUT VIC AB 0 CT1 27 (SUTURE) ×3
SUT VIC AB 0 CT1 27XBRD ANBCTR (SUTURE) ×1 IMPLANT
SUT VIC AB 0 CT1 8-18 (SUTURE)
SUT VIC AB 2-0 CT1 27 (SUTURE)
SUT VIC AB 2-0 CT1 TAPERPNT 27 (SUTURE) IMPLANT
SUT VIC AB 2-0 SH 27 (SUTURE) ×3
SUT VIC AB 2-0 SH 27XBRD (SUTURE) ×1 IMPLANT
SUT VIC AB 4-0 P-3 18XBRD (SUTURE) IMPLANT
SUT VIC AB 4-0 P2 18 (SUTURE) IMPLANT
SUT VIC AB 4-0 P3 18 (SUTURE)
SYR BULB 3OZ (MISCELLANEOUS) ×3 IMPLANT
SYR CONTROL 10ML LL (SYRINGE) ×3 IMPLANT
TOWEL OR 17X24 6PK STRL BLUE (TOWEL DISPOSABLE) ×6 IMPLANT
TRAY DSU PREP LF (CUSTOM PROCEDURE TRAY) ×3 IMPLANT
TUBE CONNECTING 20'X1/4 (TUBING) ×1
TUBE CONNECTING 20X1/4 (TUBING) ×2 IMPLANT
UNDERPAD 30X30 INCONTINENT (UNDERPADS AND DIAPERS) ×3 IMPLANT
YANKAUER SUCT BULB TIP NO VENT (SUCTIONS) ×3 IMPLANT

## 2014-05-16 NOTE — Discharge Instructions (Signed)
Post Anesthesia Home Care Instructions  Activity: Get plenty of rest for the remainder of the day. A responsible adult should stay with you for 24 hours following the procedure.  For the next 24 hours, DO NOT: -Drive a car -Advertising copywriterperate machinery -Drink alcoholic beverages -Take any medication unless instructed by your physician -Make any legal decisions or sign important papers.  Meals: Start with liquid foods such as gelatin or soup. Progress to regular foods as tolerated. Avoid greasy, spicy, heavy foods. If nausea and/or vomiting occur, drink only clear liquids until the nausea and/or vomiting subsides. Call your physician if vomiting continues.  Special Instructions/Symptoms: Your throat may feel dry or sore from the anesthesia or the breathing tube placed in your throat during surgery. If this causes discomfort, gargle with warm salt water. The discomfort should disappear within 24 hours.  If you had a scopolamine patch placed behind your ear for the management of post- operative nausea and/or vomiting:  1. The medication in the patch is effective for 72 hours, after which it should be removed.  Wrap patch in a tissue and discard in the trash. Wash hands thoroughly with soap and water. 2. You may remove the patch earlier than 72 hours if you experience unpleasant side effects which may include dry mouth, dizziness or visual disturbances. 3. Avoid touching the patch. Wash your hands with soap and water after contact with the patch.     Postoperative instructions:  Weightbearing: non weight bearing  Dressing instructions: Keep your dressing and/or splint clean and dry at all times.  It will be removed at your first post-operative appointment.  Your stitches and/or staples will be removed at this visit.  Incision instructions:  Do not soak your incision for 3 weeks after surgery.  If the incision gets wet, pat dry and do not scrub the incision.  Pain control:  You have been given a  prescription to be taken as directed for post-operative pain control.  In addition, elevate the operative extremity above the heart at all times to prevent swelling and throbbing pain.  Take over-the-counter Colace, 100mg  by mouth twice a day while taking narcotic pain medications to help prevent constipation.  Follow up appointments: 1) 10-14 days for suture removal and wound check. 2) Dr. Roda ShuttersXu as scheduled.   -------------------------------------------------------------------------------------------------------------  After Surgery Pain Control:  After your surgery, post-surgical discomfort or pain is likely. This discomfort can last several days to a few weeks. At certain times of the day your discomfort may be more intense.  Did you receive a nerve block?  A nerve block can provide pain relief for one hour to two days after your surgery. As long as the nerve block is working, you will experience little or no sensation in the area the surgeon operated on.  As the nerve block wears off, you will begin to experience pain or discomfort. It is very important that you begin taking your prescribed pain medication before the nerve block fully wears off. Treating your pain at the first sign of the block wearing off will ensure your pain is better controlled and more tolerable when full-sensation returns. Do not wait until the pain is intolerable, as the medicine will be less effective. It is better to treat pain in advance than to try and catch up.  General Anesthesia:  If you did not receive a nerve block during your surgery, you will need to start taking your pain medication shortly after your surgery and should continue to do so  as prescribed by your surgeon.  Pain Medication:  Most commonly we prescribe Vicodin and Percocet for post-operative pain. Both of these medications contain a combination of acetaminophen (Tylenol) and a narcotic to help control pain.   It takes between 30 and 45 minutes  before pain medication starts to work. It is important to take your medication before your pain level gets too intense.   Nausea is a common side effect of many pain medications. You will want to eat something before taking your pain medicine to help prevent nausea.   If you are taking a prescription pain medication that contains acetaminophen, we recommend that you do not take additional over the counter acetaminophen (Tylenol).  Other pain relieving options:   Using a cold pack to ice the affected area a few times a day (15 to 20 minutes at a time) can help to relieve pain, reduce swelling and bruising.   Elevation of the affected area can also help to reduce pain and swelling.

## 2014-05-16 NOTE — H&P (Signed)
PREOPERATIVE H&P  Chief Complaint: left scaphoid nonunion  HPI: Scott Griffin is a 54 y.o. male who presents for surgical treatment of left scaphoid nonunion.  He denies any changes in medical history.  Past Medical History  Diagnosis Date  . Diabetes mellitus without complication   . Hypertension   . High cholesterol    Past Surgical History  Procedure Laterality Date  . Back surgery  [id number]    lumbar disk   History   Social History  . Marital Status: Married    Spouse Name: N/A  . Number of Children: N/A  . Years of Education: N/A   Social History Main Topics  . Smoking status: Current Every Day Smoker -- 1.00 packs/day    Types: Cigarettes  . Smokeless tobacco: Not on file  . Alcohol Use: No  . Drug Use: No  . Sexual Activity: Not on file   Other Topics Concern  . None   Social History Narrative   Family History  Problem Relation Age of Onset  . Hypertension Mother   . Diabetes Mother   . Hypertension Father   . Diabetes Father   . Diabetes Sister    No Known Allergies Prior to Admission medications   Medication Sig Start Date End Date Taking? Authorizing Provider  amLODipine (NORVASC) 5 MG tablet Take 5 mg by mouth daily.    Historical Provider, MD  aspirin 325 MG tablet Take 325 mg by mouth daily.    Historical Provider, MD  benazepril (LOTENSIN) 40 MG tablet Take 40 mg by mouth daily.    Historical Provider, MD  Insulin Detemir (LEVEMIR FLEXPEN) 100 UNIT/ML Pen Inject 20 Units into the skin daily at 10 pm.    Historical Provider, MD  metFORMIN (GLUCOPHAGE) 500 MG tablet Take 1,000 mg by mouth 2 (two) times daily with a meal. Takes two tablets twice daily    Historical Provider, MD     Positive ROS: All other systems have been reviewed and were otherwise negative with the exception of those mentioned in the HPI and as above.  Physical Exam: General: Alert, no acute distress Cardiovascular: No pedal edema Respiratory: No cyanosis, no use of  accessory musculature GI: abdomen soft Skin: No lesions in the area of chief complaint Neurologic: Sensation intact distally Psychiatric: Patient is competent for consent with normal mood and affect Lymphatic: no lymphedema  MUSCULOSKELETAL: exam stable  Assessment: left scaphoid nonunion  Plan: Plan for Procedure(s): OPEN REDUCTION INTERNAL FIXATION (ORIF) LEFT SCAPHOID WITH ILIAC CREST BONE GRAFT  The risks benefits and alternatives were discussed with the patient including but not limited to the risks of nonoperative treatment, versus surgical intervention including infection, bleeding, nerve injury,  blood clots, cardiopulmonary complications, morbidity, mortality, among others, and they were willing to proceed.   Cheral AlmasXu, Naiping Michael, MD   05/16/2014 7:12 AM

## 2014-05-16 NOTE — Anesthesia Postprocedure Evaluation (Signed)
  Anesthesia Post-op Note  Patient: Scott Griffin  Procedure(s) Performed: Procedure(s): OPEN REDUCTION INTERNAL FIXATION (ORIF) LEFT SCAPHOID WITH ILIAC CREST BONE GRAFT (Left)  Patient Location: PACU  Anesthesia Type: General   Level of Consciousness: awake, alert  and oriented  Airway and Oxygen Therapy: Patient Spontanous Breathing  Post-op Pain: mild  Post-op Assessment: Post-op Vital signs reviewed  Post-op Vital Signs: Reviewed  Last Vitals:  Filed Vitals:   05/16/14 1215  BP: 136/77  Pulse: 99  Temp:   Resp: 13    Complications: No apparent anesthesia complications

## 2014-05-16 NOTE — Op Note (Signed)
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 next  Anesthesia: General  Estimated blood loss: Minimal  Complications: None  Condition to PACU: Stable  Indications for procedure: Mr. Scott Griffin is a 54 year old gentleman who developed a scaphoid nonunion from closed treatment. He presents today for surgical treatment of the above mentioned condition. He is aware of the risks, benefits, and alternatives to surgery and he wished to proceed.  Description of procedure: The patient was identified in the preoperative holding area. The operative site was marked by the surgeon confirmed with the patient. He is brought back to the operating room. His placed supine on the table. A bump was placed in the left hip. The left iliac crest and the left upper extremity were prepped and draped in standard sterile fashion. Timeout was performed. Preoperative antibiotics were given. The extremity was exsanguinated using Esmarch bandage and the tourniquet was inflated to 250 mmHg. A classic volar approach to the scaphoid was used. Blunt dissection was taken down to level of the FCR. The FCR was retracted ulnarly and the radial artery was retracted radially. We continued our dissection down to the scaphoid fracture. We identified the nonunion site. There was a fibrous nonunion of the waist. The fibrous material was debrided with a rongeur. The bone surfaces were freshened up using a burr. We then obtained the iliac crest bone graft from the left iliac crest. A tricortical bone graft was taken from the left iliac crest and placed in the nonunion site of the scaphoid. We then affected a reduction and the reduction was  held in place with a K wire. Orthogonal x-rays were taken to confirm adequate reduction and hardware placement. We then drilled the near cortex. We measured the screw to be 22 mm. We then advanced the compression screw over the wire. The screw was placed as close to center center as possible. Final x-rays were taken. The wounds were thoroughly irrigated and closed in layer fashion using 0 Vicryl for the fascia and joint capsule, 2-0 Vicryl for the subcutaneous layer, a running 4-0 Monocryl for the donor site and interrupted 3-0 nylon for the left wrist. Sterile dressings were applied. The left upper extremity was immobilized in a thumb spica splint. The patient was extubated and transferred to the PACU in stable condition.  Postoperative plan: The patient will be nonweightbearing to the left upper extremity. He will follow-up in the office in 2 weeks for suture removal.  N. Glee ArvinMichael Xu, MD [facility] [phone] 11:26 AM

## 2014-05-16 NOTE — Transfer of Care (Signed)
Immediate Anesthesia Transfer of Care Note  Patient: Scott Griffin  Procedure(s) Performed: Procedure(s): OPEN REDUCTION INTERNAL FIXATION (ORIF) LEFT SCAPHOID WITH ILIAC CREST BONE GRAFT (Left)  Patient Location: PACU  Anesthesia Type:General  Level of Consciousness: awake, alert , oriented and patient cooperative  Airway & Oxygen Therapy: Patient Spontanous Breathing and Patient connected to face mask oxygen  Post-op Assessment: Report given to RN and Post -op Vital signs reviewed and stable  Post vital signs: Reviewed and stable  Last Vitals:  Filed Vitals:   05/16/14 0825  BP: 149/94  Pulse: 77  Temp: 36.8 C  Resp: 20    Complications: No apparent anesthesia complications

## 2014-05-16 NOTE — Anesthesia Preprocedure Evaluation (Addendum)
Anesthesia Evaluation  Patient identified by MRN, date of birth, ID band Patient awake    Reviewed: Allergy & Precautions, NPO status , Patient's Chart, lab work & pertinent test results  Airway Mallampati: I  TM Distance: >3 FB Neck ROM: Full    Dental  (+) Teeth Intact, Dental Advisory Given   Pulmonary Current Smoker,  breath sounds clear to auscultation        Cardiovascular hypertension, Pt. on medications Rhythm:Regular Rate:Normal     Neuro/Psych    GI/Hepatic   Endo/Other  diabetes, Well Controlled, Type 2, Oral Hypoglycemic Agents, Insulin Dependent  Renal/GU      Musculoskeletal   Abdominal   Peds  Hematology   Anesthesia Other Findings   Reproductive/Obstetrics                            Anesthesia Physical Anesthesia Plan  ASA: III  Anesthesia Plan: General   Post-op Pain Management:    Induction: Intravenous  Airway Management Planned: LMA  Additional Equipment:   Intra-op Plan:   Post-operative Plan: Extubation in OR  Informed Consent: I have reviewed the patients History and Physical, chart, labs and discussed the procedure including the risks, benefits and alternatives for the proposed anesthesia with the patient or authorized representative who has indicated his/her understanding and acceptance.   Dental advisory given  Plan Discussed with: CRNA, Anesthesiologist and Surgeon  Anesthesia Plan Comments:         Anesthesia Quick Evaluation

## 2014-05-17 ENCOUNTER — Encounter (HOSPITAL_BASED_OUTPATIENT_CLINIC_OR_DEPARTMENT_OTHER): Payer: Self-pay | Admitting: Orthopaedic Surgery

## 2014-05-22 ENCOUNTER — Encounter (HOSPITAL_BASED_OUTPATIENT_CLINIC_OR_DEPARTMENT_OTHER): Payer: Self-pay | Admitting: Orthopaedic Surgery

## 2014-06-08 DIAGNOSIS — E782 Mixed hyperlipidemia: Secondary | ICD-10-CM | POA: Diagnosis not present

## 2014-06-08 DIAGNOSIS — E663 Overweight: Secondary | ICD-10-CM | POA: Diagnosis not present

## 2014-06-08 DIAGNOSIS — E1165 Type 2 diabetes mellitus with hyperglycemia: Secondary | ICD-10-CM | POA: Diagnosis not present

## 2014-06-08 DIAGNOSIS — I10 Essential (primary) hypertension: Secondary | ICD-10-CM | POA: Diagnosis not present

## 2014-06-08 DIAGNOSIS — Z6828 Body mass index (BMI) 28.0-28.9, adult: Secondary | ICD-10-CM | POA: Diagnosis not present

## 2014-10-24 ENCOUNTER — Encounter (HOSPITAL_COMMUNITY): Payer: Self-pay | Admitting: *Deleted

## 2014-10-24 ENCOUNTER — Telehealth: Payer: Self-pay | Admitting: Orthopedic Surgery

## 2014-10-24 ENCOUNTER — Emergency Department (HOSPITAL_COMMUNITY): Payer: Worker's Compensation

## 2014-10-24 ENCOUNTER — Emergency Department (HOSPITAL_COMMUNITY)
Admission: EM | Admit: 2014-10-24 | Discharge: 2014-10-24 | Disposition: A | Discharge status: Home / Self Care | Payer: Worker's Compensation | Attending: Emergency Medicine | Admitting: Emergency Medicine

## 2014-10-24 DIAGNOSIS — Y9389 Activity, other specified: Secondary | ICD-10-CM | POA: Diagnosis not present

## 2014-10-24 DIAGNOSIS — Z794 Long term (current) use of insulin: Secondary | ICD-10-CM | POA: Insufficient documentation

## 2014-10-24 DIAGNOSIS — Z7982 Long term (current) use of aspirin: Secondary | ICD-10-CM | POA: Insufficient documentation

## 2014-10-24 DIAGNOSIS — Z720 Tobacco use: Secondary | ICD-10-CM | POA: Diagnosis not present

## 2014-10-24 DIAGNOSIS — Z79899 Other long term (current) drug therapy: Secondary | ICD-10-CM | POA: Diagnosis not present

## 2014-10-24 DIAGNOSIS — E119 Type 2 diabetes mellitus without complications: Secondary | ICD-10-CM | POA: Diagnosis not present

## 2014-10-24 DIAGNOSIS — S92324A Nondisplaced fracture of second metatarsal bone, right foot, initial encounter for closed fracture: Secondary | ICD-10-CM | POA: Insufficient documentation

## 2014-10-24 DIAGNOSIS — I10 Essential (primary) hypertension: Secondary | ICD-10-CM | POA: Diagnosis not present

## 2014-10-24 DIAGNOSIS — Y990 Civilian activity done for income or pay: Secondary | ICD-10-CM | POA: Insufficient documentation

## 2014-10-24 DIAGNOSIS — S92334A Nondisplaced fracture of third metatarsal bone, right foot, initial encounter for closed fracture: Secondary | ICD-10-CM | POA: Insufficient documentation

## 2014-10-24 DIAGNOSIS — Y9289 Other specified places as the place of occurrence of the external cause: Secondary | ICD-10-CM | POA: Diagnosis not present

## 2014-10-24 DIAGNOSIS — W208XXA Other cause of strike by thrown, projected or falling object, initial encounter: Secondary | ICD-10-CM | POA: Diagnosis not present

## 2014-10-24 DIAGNOSIS — S99921A Unspecified injury of right foot, initial encounter: Secondary | ICD-10-CM | POA: Diagnosis present

## 2014-10-24 DIAGNOSIS — S92301A Fracture of unspecified metatarsal bone(s), right foot, initial encounter for closed fracture: Secondary | ICD-10-CM

## 2014-10-24 MED ORDER — OXYCODONE-ACETAMINOPHEN 5-325 MG PO TABS: 1.0000 | ORAL_TABLET | ORAL | Status: DC | PRN

## 2014-10-24 NOTE — ED Provider Notes (Signed)
CSN: [id number]     Arrival date & time 10/24/14  7829 History   First MD Initiated Contact with Patient 10/24/14 1102     Chief Complaint  Patient presents with  . Foot Injury     (Consider location/radiation/quality/duration/timing/severity/associated sxs/prior Treatment) HPI   Scott Griffin is a 54 y.o. male who presents to the Emergency Department complaining of right foot pain and swelling after a direct blow to his foot that occurred when he accidentally dropped a large propane tank onto his foot.  Injury occurred shortly prior to arrival and occurred at work.  He c/o throbbing pain to his foot that's worse with weight bearing.  He has not tried any therapies prior to arrival.  He denies numbness or weakness of the foot, open wounds, and pain or swelling proximal to the dorsal foot.     Past Medical History  Diagnosis Date  . Diabetes mellitus without complication   . Hypertension   . High cholesterol    Past Surgical History  Procedure Laterality Date  . Back surgery  5621,3086    lumbar disk  . Open reduction internal fixation (orif) scaphoid with iliac crest bone graft Left 05/16/2014    Procedure: OPEN REDUCTION INTERNAL FIXATION (ORIF) LEFT SCAPHOID WITH ILIAC CREST BONE GRAFT;  Surgeon: Tarry Kos, MD;  Location: [facility];  Service: Orthopedics;  Laterality: Left;   Family History  Problem Relation Age of Onset  . Hypertension Mother   . Diabetes Mother   . Hypertension Father   . Diabetes Father   . Diabetes Sister    Social History  Substance Use Topics  . Smoking status: Current Every Day Smoker -- 1.00 packs/day    Types: Cigarettes  . Smokeless tobacco: None  . Alcohol Use: No    Review of Systems  Constitutional: Negative for fever and chills.  Genitourinary: Negative for dysuria and difficulty urinating.  Musculoskeletal: Positive for joint swelling and arthralgias.  Skin: Negative for color change and wound.  All other  systems reviewed and are negative.     Allergies  Review of patient's allergies indicates no known allergies.  Home Medications   Prior to Admission medications   Medication Sig Start Date End Date Taking? Authorizing Provider  amLODipine (NORVASC) 5 MG tablet Take 5 mg by mouth daily.    Historical Provider, MD  aspirin 325 MG tablet Take 325 mg by mouth daily.    Historical Provider, MD  benazepril (LOTENSIN) 40 MG tablet Take 40 mg by mouth daily.    Historical Provider, MD  Insulin Detemir (LEVEMIR FLEXPEN) 100 UNIT/ML Pen Inject 20 Units into the skin daily at 10 pm.    Historical Provider, MD  metFORMIN (GLUCOPHAGE) 500 MG tablet Take 1,000 mg by mouth 2 (two) times daily with a meal. Takes two tablets twice daily    Historical Provider, MD  oxyCODONE (OXY IR/ROXICODONE) 5 MG immediate release tablet Take 1-3 tablets (5-15 mg total) by mouth every 4 (four) hours as needed. 05/16/14   Naiping Donnelly Stager, MD   BP 131/83 mmHg  Pulse 103  Temp(Src) 97.9 F (36.6 C) (Oral)  Resp 16  Ht  (1.753 m)  Wt 198 lb (89.812 kg)  BMI 29.23 kg/m2  SpO2 100%    Physical Exam  Constitutional: He is oriented to person, place, and time. He appears well-developed and well-nourished. No distress.  HENT:  Head: Normocephalic and atraumatic.  Cardiovascular: Normal rate, regular rhythm, normal heart sounds and  intact distal pulses.   Pulmonary/Chest: Effort normal and breath sounds normal.  Musculoskeletal: He exhibits tenderness.  Tenderness and edema of the dorsal right foot.  Mild ecchymosis present.  DP pulse is brisk,distal sensation intact.  No erythema, abrasion, or bony deformity.  No proximal tenderness or edema    Neurological: He is alert and oriented to person, place, and time. He exhibits normal muscle tone. Coordination normal.  Skin: Skin is warm and dry.  Nursing note and vitals reviewed.   ED Course  Procedures (including critical care time) Labs Review Labs Reviewed - No  data to display  Imaging Review Dg Foot Complete Right  10/24/2014   CLINICAL DATA:  Propane  fell on foot today.  Foot pain  EXAM: RIGHT FOOT COMPLETE - 3+ VIEW  COMPARISON:  None.  FINDINGS: Nondisplaced fractures mid third and second metatarsals. No other fracture.  Hallux valgus deformity.  Mild degenerative change of the first MTP.  IMPRESSION: Acute nondisplaced fracture second and third metatarsal   Electronically Signed   By: Marlan Palau M.D.   On: 10/24/2014 09:58   I have personally reviewed and evaluated these images and lab results as part of my medical decision-making.   EKG Interpretation None      MDM   Final diagnoses:  Fracture of second metatarsal bone, right, closed, initial encounter  Fracture of third metatarsal bone, right, closed, initial encounter    XR results discussed with patient.  He prefers to f/u with Dr. Romeo Apple.  Dr, Romeo Apple not on call today.  This is a worker's comp injury.  I have spoke the patient's supervisor and explained the injury and care plan.  This was at the patient's request.  Patient agrees to elevate, ice , non-wt bearing and close ortho f/u.    Posterior splint applied, remains NV intact.  Pain improved.  rx for percocet.  Crutches given  Short sposterior splint applied, crutches given.  Pain improved, remains NV intact.    Pauline Aus, PA-C 10/25/14 2134  Gilda Crease, MD 10/29/14 0900

## 2014-10-24 NOTE — Telephone Encounter (Signed)
Patient came to office to inquire about appointment following Emergency Room,work-related injury to foot, which occurred at work.  Relayed worker's comp protocol, and also that the on-call orthopedist at time of visit to [facility], today, 10/24/14, is not Dr Romeo Apple.  Patient will talk with employer and will have employer request appointment with an in-network provider per their worker's comp insurance,

## 2014-10-24 NOTE — Discharge Instructions (Signed)
Metatarsal Fracture, Undisplaced  A metatarsal fracture is a break in the bone(s) of the foot. These are the bones of the foot that connect your toes to the bones of the ankle.  DIAGNOSIS   The diagnoses of these fractures are usually made with X-rays. If there are problems in the forefoot and x-rays are normal a later bone scan will usually make the diagnosis.   TREATMENT AND HOME CARE INSTRUCTIONS  · Treatment may or may not include a cast or walking shoe. When casts are needed the use is usually for short periods of time so as not to slow down healing with muscle wasting (atrophy).  · Activities should be stopped until further advised by your caregiver.  · Wear shoes with adequate shock absorbing capabilities and stiff soles.  · Alternative exercise may be undertaken while waiting for healing. These may include bicycling and swimming, or as your caregiver suggests.  · It is important to keep all follow-up visits or specialty referrals. The failure to keep these appointments could result in improper bone healing and chronic pain or disability.  · Warning: Do not drive a car or operate a motor vehicle until your caregiver specifically tells you it is safe to do so.  IF YOU DO NOT HAVE A CAST OR SPLINT:  · You may walk on your injured foot as tolerated or advised.  · Do not put any weight on your injured foot for as long as directed by your caregiver. Slowly increase the amount of time you walk on the foot as the pain allows or as advised.  · Use crutches until you can bear weight without pain. A gradual increase in weight bearing may help.  · Apply ice to the injury for 15-20 minutes each hour while awake for the first 2 days. Put the ice in a plastic bag and place a towel between the bag of ice and your skin.  · Only take over-the-counter or prescription medicines for pain, discomfort, or fever as directed by your caregiver.  SEEK IMMEDIATE MEDICAL CARE IF:   · Your cast gets damaged or breaks.  · You have  continued severe pain or more swelling than you did before the cast was put on, or the pain is not controlled with medications.  · Your skin or nails below the injury turn blue or grey, or feel cold or numb.  · There is a bad smell, or new stains or pus-like (purulent) drainage coming from the cast.  MAKE SURE YOU:   · Understand these instructions.  · Will watch your condition.  · Will get help right away if you are not doing well or get worse.  Document Released: 10/25/2001 Document Revised: 04/27/2011 Document Reviewed: 09/16/2007  ExitCare® Patient Information ©2015 [employer]. This information is not intended to replace advice given to you by your health care provider. Make sure you discuss any questions you have with your health care provider.

## 2014-10-24 NOTE — ED Notes (Addendum)
Propane tank dropped on right foot. Pain to top and lateral side of right foot with pain radiating up lower leg when attempting to walk. Pt states pain with bearing weight, limp noticed. 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

## 2014-10-30 ENCOUNTER — Telehealth: Payer: Self-pay | Admitting: Orthopedic Surgery

## 2014-10-30 NOTE — Telephone Encounter (Signed)
Received call from [employer] worker's comp insurer, [employer] [place], [phone] ; per Joneen Boers, received verbal authorization for appointment regarding 10/24/14 injury to foot, per previous note and per Emergency Room visit at [facility].  Faxed our set up forms to her attention at Fax [phone]. Called patient to notify of status.  Appointment pending receipt of completed forms. Patient's ph# is [phone]

## 2014-10-31 NOTE — Telephone Encounter (Signed)
Patient returned call; appointment scheduled.  Claim# received per [facility], per [place], adjuster: #[id number], date of right foot injury 10/24/14.

## 2014-11-05 ENCOUNTER — Encounter: Payer: Self-pay | Admitting: Orthopedic Surgery

## 2014-11-05 ENCOUNTER — Ambulatory Visit (INDEPENDENT_AMBULATORY_CARE_PROVIDER_SITE_OTHER): Payer: Worker's Compensation | Admitting: Orthopedic Surgery

## 2014-11-05 DIAGNOSIS — S92301A Fracture of unspecified metatarsal bone(s), right foot, initial encounter for closed fracture: Secondary | ICD-10-CM | POA: Diagnosis not present

## 2014-11-05 MED ORDER — HYDROCODONE-ACETAMINOPHEN 7.5-325 MG PO TABS: 1.0000 | ORAL_TABLET | Freq: Four times a day (QID) | ORAL | Status: DC | PRN

## 2014-11-05 NOTE — Progress Notes (Signed)
Patient ID: Scott Griffin, male   DOB: Dec 09, 1960, 54 y.o.   MRN: [id number]  New workers comp  Chief Complaint  Patient presents with  . Foot Injury    er follow up right foot injury, propane tank fell on foot 10/24/14     Scott Griffin is a 54 y.o. male.   HPI 54 year old male injured September 7 work related propane tank fell on his right foot. Complained of pain and swelling. Went to the ER. X-ray so second third transverse metatarsal fracture nondisplaced he complains of constant 8 out of 10 pain he was given crutches and a posterior splint he presents for evaluation and treatment  He reports redness in his eyes and vision disturbance cough from smoking chronic back pain Review of Systems See hpi  Past Medical History  Diagnosis Date  . Diabetes mellitus without complication   . Hypertension   . High cholesterol     Past Surgical History  Procedure Laterality Date  . Back surgery  4098,1191    lumbar disk  . Open reduction internal fixation (orif) scaphoid with iliac crest bone graft Left 05/16/2014    Procedure: OPEN REDUCTION INTERNAL FIXATION (ORIF) LEFT SCAPHOID WITH ILIAC CREST BONE GRAFT;  Surgeon: Tarry Kos, MD;  Location: [facility];  Service: Orthopedics;  Laterality: Left;    Family History  Problem Relation Age of Onset  . Hypertension Mother   . Diabetes Mother   . Hypertension Father   . Diabetes Father   . Diabetes Sister     Social History Social History  Substance Use Topics  . Smoking status: Current Every Day Smoker -- 1.00 packs/day    Types: Cigarettes  . Smokeless tobacco: None  . Alcohol Use: No    No Known Allergies  Current Outpatient Prescriptions  Medication Sig Dispense Refill  . amLODipine (NORVASC) 5 MG tablet Take 5 mg by mouth daily.    Marland Kitchen aspirin 325 MG tablet Take 325 mg by mouth daily.    . benazepril (LOTENSIN) 40 MG tablet Take 40 mg by mouth daily.    . Insulin Detemir (LEVEMIR FLEXPEN) 100  UNIT/ML Pen Inject 20 Units into the skin daily at 10 pm.    . metFORMIN (GLUCOPHAGE) 500 MG tablet Take 1,000 mg by mouth 2 (two) times daily with a meal. Takes two tablets twice daily    . HYDROcodone-acetaminophen (NORCO) 7.5-325 MG per tablet Take 1 tablet by mouth every 6 (six) hours as needed for moderate pain. 56 tablet 0   No current facility-administered medications for this visit.       Physical Exam Blood pressure 136/101, height  (1.753 m), weight 198 lb (89.812 kg). Physical Exam The patient is well developed well nourished and well groomed. Orientation to person place and time is normal  Mood is pleasant. Ambulatory status is normal without a limp Right foot is swollen tender second and third metatarsal hole foot is actually swollen tenderness over the fracture site joint range of motion and stability of ankle and metatarsophalangeal joints is not affected ISkin remains intact without laceration ulceration or erythema Gross motor exam is intact without atrophy. Muscle tone normal grade 5 motor strength Neurovascular exam remains intact  Data Reviewed The films I review and they show second and third transverse metatarsal fracture nondisplaced  Assessment Encounter Diagnoses  Name Primary?  . Fracture of second metatarsal bone, right, closed, initial encounter   . Fracture of third metatarsal bone, right, closed, initial  encounter     Plan Short Cam Walker weightbearing as tolerated follow-up 6 weeks for x-ray no work for 6 weeks  Meds ordered this encounter  Medications  . HYDROcodone-acetaminophen (NORCO) 7.5-325 MG per tablet    Sig: Take 1 tablet by mouth every 6 (six) hours as needed for moderate pain.    Dispense:  56 tablet    Refill:  0

## 2014-11-15 ENCOUNTER — Encounter (INDEPENDENT_AMBULATORY_CARE_PROVIDER_SITE_OTHER): Payer: Self-pay | Admitting: *Deleted

## 2014-12-17 ENCOUNTER — Encounter: Payer: Self-pay | Admitting: Orthopedic Surgery

## 2014-12-17 ENCOUNTER — Ambulatory Visit (INDEPENDENT_AMBULATORY_CARE_PROVIDER_SITE_OTHER): Payer: Worker's Compensation

## 2014-12-17 ENCOUNTER — Ambulatory Visit (INDEPENDENT_AMBULATORY_CARE_PROVIDER_SITE_OTHER): Payer: Worker's Compensation | Admitting: Orthopedic Surgery

## 2014-12-17 VITALS — BP 135/87 | Ht 69.0 in | Wt 198.0 lb

## 2014-12-17 DIAGNOSIS — S92901D Unspecified fracture of right foot, subsequent encounter for fracture with routine healing: Secondary | ICD-10-CM

## 2014-12-17 NOTE — Patient Instructions (Addendum)
Return to work December 24 2014  May remove boot and go back into regular shoe

## 2014-12-19 NOTE — Progress Notes (Signed)
Follow workers compensation case  Tank top fell on left foot back in September. He is [age] years old. He was placed in a Cam Walker for a fractureof the second and third metatarsals  He is doing well with no tenderness  X-rays show callus bridging both fractures  Patient can resume work after 1 week out of the boot

## 2015-01-18 DIAGNOSIS — M79671 Pain in right foot: Secondary | ICD-10-CM | POA: Diagnosis not present

## 2015-01-18 DIAGNOSIS — S92901S Unspecified fracture of right foot, sequela: Secondary | ICD-10-CM | POA: Diagnosis not present

## 2015-01-18 DIAGNOSIS — Z1389 Encounter for screening for other disorder: Secondary | ICD-10-CM | POA: Diagnosis not present

## 2015-01-18 DIAGNOSIS — Z6828 Body mass index (BMI) 28.0-28.9, adult: Secondary | ICD-10-CM | POA: Diagnosis not present

## 2015-02-07 ENCOUNTER — Encounter: Payer: Self-pay | Admitting: Orthopedic Surgery

## 2015-02-07 ENCOUNTER — Ambulatory Visit (INDEPENDENT_AMBULATORY_CARE_PROVIDER_SITE_OTHER): Payer: Worker's Compensation | Admitting: Orthopedic Surgery

## 2015-02-07 VITALS — BP 130/97 | Ht 69.0 in | Wt 198.0 lb

## 2015-02-07 DIAGNOSIS — S92901S Unspecified fracture of right foot, sequela: Secondary | ICD-10-CM

## 2015-02-07 NOTE — Progress Notes (Addendum)
Follow up   W/C  Chief Complaint  Patient presents with  . Follow-up    follow up Rt foot fx, DOI 10/24/14   Prior history Chief Complaint   Patient presents with   .  Foot Injury       er follow up right foot injury, propane tank fell on foot 10/24/14      Scott Griffin is a 54 y.o. male.   HPI 54 year old male injured September 7 work related propane tank fell on his right foot. Complained of pain and swelling. Went to the ER. X-ray so second third transverse metatarsal fracture nondisplaced he complains of constant 8 out of 10 pain he was given crutches and a posterior splint he presents for evaluation and treatment   todays eval:  Mr. Scott Griffin had a right foot fracture as described he was treated with closed measures did well and went back to work complains of sharp pain at the fracture site on an intermittent basis primarily with weightbearing  Review of systems no numbness or tingling is noted. The swelling is from the fracture callus. No constitutional symptoms.  Past Medical History  Diagnosis Date  . Diabetes mellitus without complication   . Hypertension   . High cholesterol    BP 130/97 mmHg  Ht 5\' 9"  (1.753 m)  Wt 198 lb (89.812 kg)  BMI 29.23 kg/m2 He is oriented 3 his mood and affect are normal his appearance is well groomed. He has tenderness over the fracture callus of the second metatarsal foot alignment shows chronic pes planus with bunion deformity. Ankle range of motion remains normal ankle is stable no atrophy is noted in the foot the scans intact sensation is normal as a good pulse has no limp and no assistive devices needed for walking.  Pain after fracture for nondisplaced second and third metatarsal fractures of the right foot   Recommend [facility] orthotics follow-up as needed  No rating from this injury

## 2016-07-19 IMAGING — CT CT WRIST*L* W/O CM
3 of 4 series · 6 of 14 positions shown, 7 images · non-contrast
Comparison: Radiographs 01/05/2014.  CT 01/10/2014.

CLINICAL DATA: Follow-up scaphoid fracture. Injury sustained
falling from a ladder 01/05/2014. Subsequent encounter.

EXAM:
CT OF THE LEFT WRIST WITHOUT CONTRAST
TECHNIQUE: Multidetector CT imaging was performed according to the standard
protocol. Multiplanar CT image reconstructions were also generated.

[Series 5: upper ext soft · axial · 0.26mm/px · z∈[-16,+34]mm · 2 of 61 slices shown]
[im 21/61  soft-tissue]
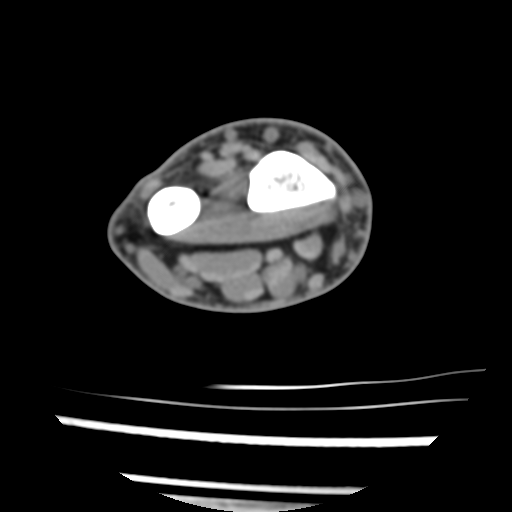
[im 41/61  soft-tissue]
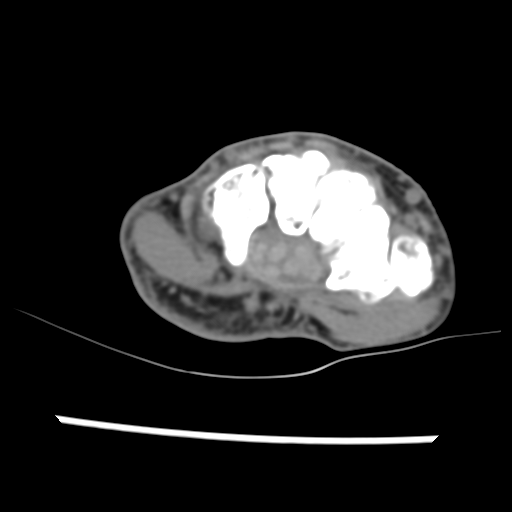

[Series 202: axial bone · axial · 0.30mm/px · z∈[-26,+19]mm · 2 of 70 slices shown, 3 images]
[im 24/70  soft-tissue]
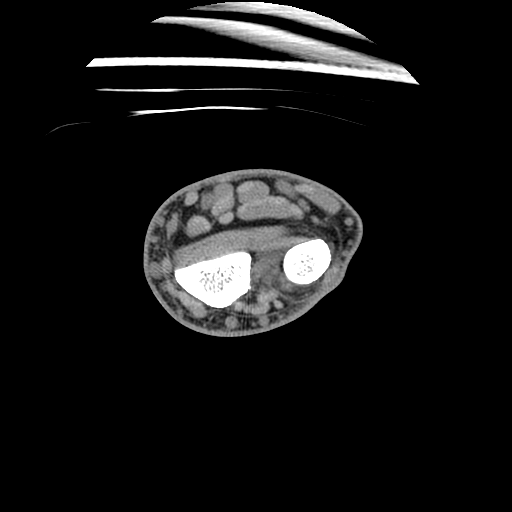
[im 24/70  bone]
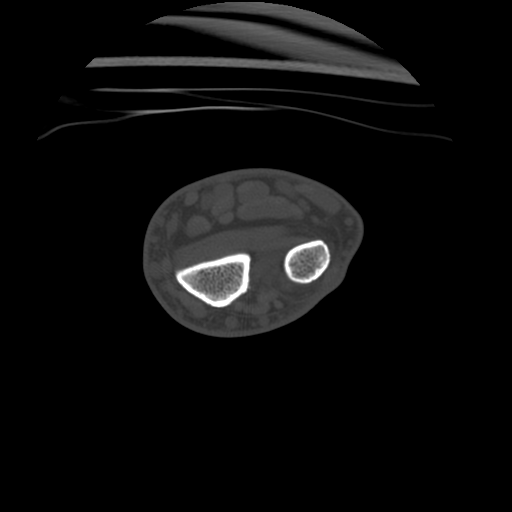
[im 47/70  bone]
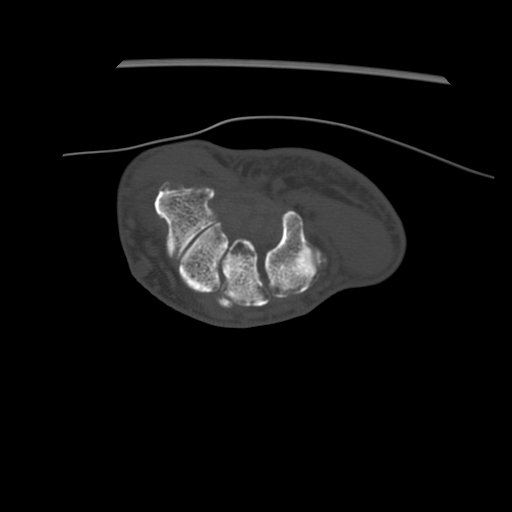

[Series 300: sag soft · sagittal · 0.30mm/px · 2 of 56 slices shown]
[im 19/56  soft-tissue]
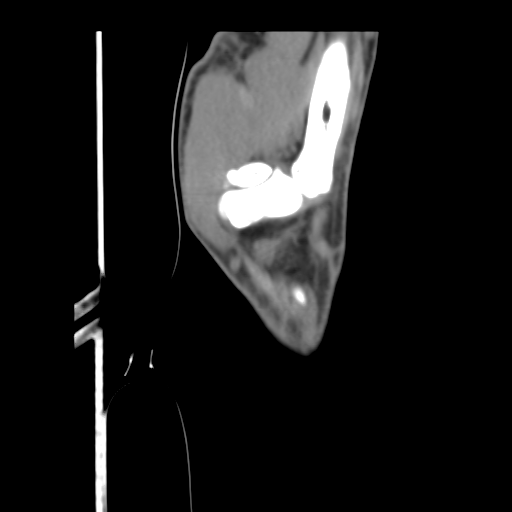
[im 37/56  soft-tissue]
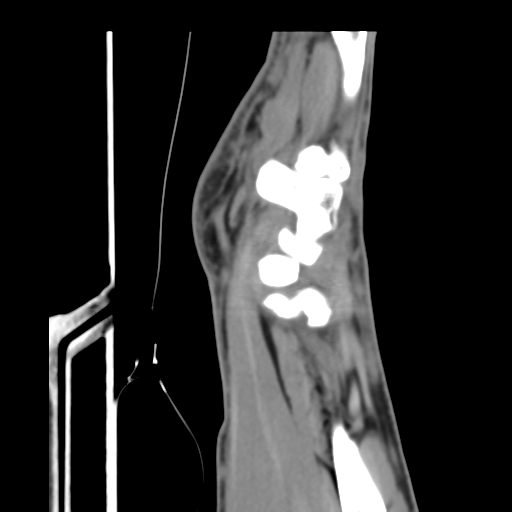

[6 of 14 positions shown; findings below may reference images not displayed]

FINDINGS: Nondisplaced slightly comminuted fracture through the mid scaphoid
waist again demonstrated without evidence of interval healing. This
appearance is consistent with delayed union and possible early
nonunion. The proximal pole is mildly sclerotic without
fragmentation or collapse. No other fractures are demonstrated.

On the sagittal images, there is progressive dorsal tilting of the
lunate suspicious for developing dorsal intercalated segmental
instability. There is no significant scapholunate diastasis. Mild
degenerative changes at the first carpal metacarpal articulation and
at the distal radioulnar joint are stable.

Soft tissue windows demonstrate radiocarpal and distal radioulnar
joint effusions. No tendon abnormalities identified.
IMPRESSION: 1. No interval healing of nondisplaced fracture through the scaphoid
waist consistent with delayed union and possible early nonunion.
2. Mild sclerosis of the proximal pole of the scaphoid could reflect
early osteonecrosis. There is no subchondral collapse or
fragmentation.
3. Progressive dorsal tilting of the lunate suspicious for
developing dorsal intercalated signal instability.

## 2016-12-14 ENCOUNTER — Ambulatory Visit (HOSPITAL_COMMUNITY)
Admission: RE | Admit: 2016-12-14 | Discharge: 2016-12-14 | Disposition: A | Discharge status: Home / Self Care | Payer: Medicare PPO | Source: Ambulatory Visit | Attending: Physician Assistant | Admitting: Physician Assistant

## 2016-12-14 ENCOUNTER — Other Ambulatory Visit (HOSPITAL_COMMUNITY): Payer: Self-pay | Admitting: Physician Assistant

## 2016-12-14 DIAGNOSIS — R9389 Abnormal findings on diagnostic imaging of other specified body structures: Secondary | ICD-10-CM | POA: Insufficient documentation

## 2016-12-14 DIAGNOSIS — R600 Localized edema: Secondary | ICD-10-CM | POA: Diagnosis not present

## 2016-12-29 ENCOUNTER — Emergency Department (HOSPITAL_COMMUNITY)
Admission: EM | Admit: 2016-12-29 | Discharge: 2016-12-29 | Disposition: A | Discharge status: Home / Self Care | Payer: Medicare PPO | Attending: Emergency Medicine | Admitting: Emergency Medicine

## 2016-12-29 ENCOUNTER — Emergency Department (HOSPITAL_COMMUNITY): Payer: Medicare PPO

## 2016-12-29 ENCOUNTER — Other Ambulatory Visit: Payer: Self-pay

## 2016-12-29 ENCOUNTER — Encounter (HOSPITAL_COMMUNITY): Payer: Self-pay | Admitting: *Deleted

## 2016-12-29 DIAGNOSIS — M542 Cervicalgia: Secondary | ICD-10-CM | POA: Diagnosis present

## 2016-12-29 DIAGNOSIS — Z794 Long term (current) use of insulin: Secondary | ICD-10-CM | POA: Diagnosis not present

## 2016-12-29 DIAGNOSIS — M5412 Radiculopathy, cervical region: Secondary | ICD-10-CM | POA: Insufficient documentation

## 2016-12-29 DIAGNOSIS — E119 Type 2 diabetes mellitus without complications: Secondary | ICD-10-CM | POA: Diagnosis not present

## 2016-12-29 DIAGNOSIS — I10 Essential (primary) hypertension: Secondary | ICD-10-CM | POA: Diagnosis not present

## 2016-12-29 DIAGNOSIS — Z79899 Other long term (current) drug therapy: Secondary | ICD-10-CM | POA: Diagnosis not present

## 2016-12-29 DIAGNOSIS — Z7982 Long term (current) use of aspirin: Secondary | ICD-10-CM | POA: Insufficient documentation

## 2016-12-29 DIAGNOSIS — F17210 Nicotine dependence, cigarettes, uncomplicated: Secondary | ICD-10-CM | POA: Diagnosis not present

## 2016-12-29 LAB — CBC
HCT: 51.3 % (ref 39.0–52.0)
Hemoglobin: 17.2 g/dL — ABNORMAL HIGH (ref 13.0–17.0)
MCH: 28.8 pg (ref 26.0–34.0)
MCHC: 33.5 g/dL (ref 30.0–36.0)
MCV: 85.8 fL (ref 78.0–100.0)
Platelets: 188 10*3/uL (ref 150–400)
RBC: 5.98 MIL/uL — AB (ref 4.22–5.81)
RDW: 13.7 % (ref 11.5–15.5)
WBC: 8.7 10*3/uL (ref 4.0–10.5)

## 2016-12-29 LAB — BASIC METABOLIC PANEL
Anion gap: 10 (ref 5–15)
BUN: 12 mg/dL (ref 6–20)
CO2: 26 mmol/L (ref 22–32)
CREATININE: 1.03 mg/dL (ref 0.61–1.24)
Calcium: 10 mg/dL (ref 8.9–10.3)
Chloride: 101 mmol/L (ref 101–111)
GFR calc non Af Amer: >60 mL/min (ref >60)
Glucose, Bld: 126 mg/dL — ABNORMAL HIGH (ref 65–99)
Potassium: 4.5 mmol/L (ref 3.5–5.1)
SODIUM: 137 mmol/L (ref 135–145)

## 2016-12-29 LAB — TROPONIN I

## 2016-12-29 MED ORDER — OXYCODONE-ACETAMINOPHEN 5-325 MG PO TABS: 1.0000 | ORAL_TABLET | ORAL | 0 refills | Status: AC | PRN

## 2016-12-29 MED ORDER — IBUPROFEN 800 MG PO TABS: 800.0000 mg | ORAL_TABLET | Freq: Three times a day (TID) | ORAL | 0 refills | Status: DC

## 2016-12-29 NOTE — ED Triage Notes (Addendum)
Pt c/o numbness feeling from left shoulder that radiates down into left fingertips, neck and back pain that is described as "something pulling" and chest "fluttering" x several months. Denis SOB and chest pain, just chest "fluttering" per pt. Reports intermittent nausea and dizziness x several months also.

## 2016-12-29 NOTE — Discharge Instructions (Signed)
Try alternating ice and heat to your neck and shoulder.  Call the provider listed to arrange a follow-up appt

## 2016-12-29 NOTE — ED Notes (Signed)
ED Provider at bedside. 

## 2016-12-31 NOTE — ED Provider Notes (Signed)
[facility]Y [facility] Provider Note   CSN: [id number] Arrival date & time: 12/29/16  [id number]     History   Chief Complaint Chief Complaint  Patient presents with  . Numbness    Left arm x several months  . Chest Pain    x several months    HPI Nyra Jabsurner M Rout is a 56 y.o. male.  HPI  Nyra Jabsurner M Overbay is a 56 y.o. male who presents to the Emergency Department complaining of numbness and tingling that radiates from his left neck into his left shoulder and down his arm to his fingertips.  Symptoms have been present for 2 months and gradually increasing in occurrence.  He describes a sensation as "pulling and tingling."  Symptoms are reproduced with certain movements of the neck.  States that he wakes up nearly every morning with tingling to his arm.  He also describes a "fluttering" in his upper chest is been going on for several months.  This is intermittent and last only a few seconds.  No triggers or alleviating factors.  He denies chest pain or shortness of breath.  Also denies previous cardiac history.    Past Medical History:  Diagnosis Date  . Diabetes mellitus without complication (HCC)   . High cholesterol   . Hypertension     There are no active problems to display for this patient.   Past Surgical History:  Procedure Laterality Date  . BACK SURGERY  [phone]   lumbar disk  . OPEN REDUCTION INTERNAL FIXATION (ORIF) SCAPHOID WITH ILIAC CREST BONE GRAFT Left 05/16/2014   Procedure: OPEN REDUCTION INTERNAL FIXATION (ORIF) LEFT SCAPHOID WITH ILIAC CREST BONE GRAFT;  Surgeon: Tarry KosNaiping M Xu, MD;  Location: [facility];  Service: Orthopedics;  Laterality: Left;       Home Medications    Prior to Admission medications   Medication Sig Start Date End Date Taking? Authorizing Provider  glipiZIDE (GLUCOTROL XL) 10 MG 24 hr tablet Take 1 tablet daily by mouth. 12/02/16  Yes [provider]  liraglutide (VICTOZA) 18 MG/3ML SOPN  Inject 1.2 mg daily into the skin. 11/16/16  Yes [provider]  amLODipine (NORVASC) 5 MG tablet Take 5 mg by mouth daily.    [provider]  aspirin 325 MG tablet Take 325 mg by mouth daily.    [provider]  benazepril (LOTENSIN) 40 MG tablet Take 40 mg by mouth daily.    [provider]  Canagliflozin-Metformin HCl ER (INVOKAMET XR) 50-1000 MG TB24 Take by mouth.    [provider]  etodolac (LODINE) 400 MG tablet Take 1 tablet 2 (two) times daily by mouth. 12/14/16   [provider]  furosemide (LASIX) 20 MG tablet Take 1 tablet daily by mouth. 12/14/16   [provider]  ibuprofen (ADVIL,MOTRIN) 800 MG tablet Take 1 tablet (800 mg total) 3 (three) times daily by mouth. Take with food 12/29/16   Saliyah Gillin, PA-C  Insulin Detemir (LEVEMIR FLEXPEN) 100 UNIT/ML Pen Inject 20 Units into the skin daily at 10 pm.    [provider]  oxyCODONE-acetaminophen (PERCOCET/ROXICET) 5-325 MG tablet Take 1 tablet every 4 (four) hours as needed by mouth. 12/29/16   Yacob Wilkerson, PA-C    Family History Family History  Problem Relation Age of Onset  . Hypertension Mother   . Diabetes Mother   . Hypertension Father   . Diabetes Father   . Diabetes Sister     Social History Social History  Tobacco Use  . Smoking status: Current Every Day Smoker    Packs/day: 1.00    Types: Cigarettes  . Smokeless tobacco: Never Used  Substance Use Topics  . Alcohol use: No  . Drug use: No     Allergies   Patient has no known allergies.   Review of Systems Review of Systems  Constitutional: Negative for chills and fever.  HENT: Negative for sore throat and trouble swallowing.   Respiratory: Negative for cough, chest tightness and shortness of breath.   Cardiovascular: Positive for palpitations (fluttering). Negative for chest pain.  Gastrointestinal: Negative for abdominal pain, blood in stool, nausea and vomiting.    Musculoskeletal: Positive for neck pain. Negative for arthralgias and joint swelling.  Skin: Negative for color change and wound.  Neurological: Positive for numbness (tingling of the left arm). Negative for dizziness, syncope, speech difficulty, weakness and headaches.  Psychiatric/Behavioral: Negative for confusion.  All other systems reviewed and are negative.    Physical Exam Updated Vital Signs BP 139/88   Pulse 75   Resp 19   Ht 5\' 9"  (1.753 m)   Wt 90.7 kg (200 lb)   SpO2 99%   BMI 29.53 kg/m   Physical Exam  Constitutional: He is oriented to person, place, and time. He appears well-developed and well-nourished.  HENT:  Head: Atraumatic.  Mouth/Throat: Oropharynx is clear and moist.  Eyes: EOM are normal. Pupils are equal, round, and reactive to light.  Neck: Normal range of motion and phonation normal. Muscular tenderness present. No spinous process tenderness present. Normal range of motion present. No thyroid mass present.  Cardiovascular: Normal rate, regular rhythm and intact distal pulses.  No murmur heard. Pulmonary/Chest: Effort normal and breath sounds normal. No respiratory distress. He exhibits no tenderness.  Musculoskeletal: Normal range of motion. He exhibits tenderness. He exhibits no edema.       Cervical back: He exhibits tenderness. He exhibits no bony tenderness.  ttp of the left SCM and trapezius muscles.  No spinal tenderness.  Grip strength strong and symmetrical.  Pan reproduced with abduction of the left arm  Neurological: He is alert and oriented to person, place, and time. He has normal strength. No sensory deficit. GCS eye subscore is 4. GCS verbal subscore is 5. GCS motor subscore is 6.  Reflex Scores:      Tricep reflexes are 2+ on the right side and 2+ on the left side.      Bicep reflexes are 2+ on the right side and 2+ on the left side. CN II-XII intact.    Skin: Skin is warm. Capillary refill takes less than 2 seconds.  Psychiatric: He  has a normal mood and affect.  Nursing note and vitals reviewed.    ED Treatments / Results  Labs (all labs ordered are listed, but only abnormal results are displayed) Labs Reviewed  BASIC METABOLIC PANEL - Abnormal; Notable for the following components:      Result Value   Glucose, Bld 126 (*)    All other components within normal limits  CBC - Abnormal; Notable for the following components:   RBC 5.98 (*)    Hemoglobin 17.2 (*)    All other components within normal limits  TROPONIN I    EKG  EKG Interpretation  Date/Time:  Tuesday December 29 2016 10:11:08 EST Ventricular Rate:  94 PR Interval:  176 QRS Duration: 88 QT Interval:  360 QTC Calculation: 450 R Axis:   45 Text Interpretation:  Normal sinus  rhythm Cannot rule out Anterior infarct , age undetermined Abnormal ECG Confirmed by Donnetta Hutchingook, Brian ([id number]) on 12/29/2016 11:24:51 AM       Radiology Dg Chest 2 View  Result Date: 12/29/2016 CLINICAL DATA:  Left shoulder numbness. EXAM: CHEST  2 VIEW COMPARISON:  None. FINDINGS: The lungs are clear without focal pneumonia, edema, pneumothorax or pleural effusion. The cardiopericardial silhouette is within normal limits for size. The visualized bony structures of the thorax are intact. Probable intra-articular body subcoracoid recess left shoulder. IMPRESSION: 1. No acute cardiopulmonary findings. 2. Calcification in the left shoulder is probably a mineralized intra-articular loose body in the sub coracoid recess. Electronically Signed   By: Kennith CenterEric  Mansell M.D.   On: 12/29/2016 11:36   Ct Cervical Spine Wo Contrast  Result Date: 12/29/2016 CLINICAL DATA:  Chronic neck pain.  Left upper extremity numbness. EXAM: CT CERVICAL SPINE WITHOUT CONTRAST TECHNIQUE: Multidetector CT imaging of the cervical spine was performed without intravenous contrast. Multiplanar CT image reconstructions were also generated. COMPARISON:  Cervical spine CT and MRI 01/05/2014 FINDINGS: Alignment:  Chronic cervical spine straightening. Unchanged 2 mm retrolisthesis of C4 on C5. Skull base and vertebrae: No evidence of acute fracture or destructive osseous process. Soft tissues and spinal canal: No evidence of prevertebral fluid or swelling. Disc levels: C2-3: Negative. C3-4: Minimal uncovertebral spurring without evidence of compressive stenosis. C4-5: Chronic disc space narrowing. Broad posterior disc protrusion and uncovertebral spurring result in moderate spinal stenosis and moderate bilateral neural foraminal stenosis, similar to the prior studies. C5-6 through C7-T1:  No significant degenerative changes identified. Upper chest: Clear lung apices. Other: Interval healing of the prior right hyoid bone fracture. Atherosclerotic calcification at the right greater than left carotid bifurcations. IMPRESSION: 1. No evidence of acute osseous abnormality. 2. Similar appearance of C4-5 disc degeneration resulting in moderate spinal and bilateral neural foraminal stenosis. Electronically Signed   By: Sebastian AcheAllen  Grady M.D.   On: 12/29/2016 12:02   Dg Shoulder Left  Result Date: 12/29/2016 CLINICAL DATA:  Pain. History of shoulder injury after accident 3 years ago. EXAM: LEFT SHOULDER - 2+ VIEW COMPARISON:  None. FINDINGS: There is no evidence of fracture or dislocation. Advanced glenohumeral osteoarthritis with joint narrowing and spurring. Subcoracoid articular body measuring nearly 3 cm. Normal acromioclavicular alignment. IMPRESSION: 1. No acute finding. 2. Advanced glenohumeral osteoarthritis with ossified body in the subcoracoid space. Electronically Signed   By: Marnee SpringJonathon  Watts M.D.   On: 12/29/2016 11:37     Procedures Procedures (including critical care time)  Medications Ordered in ED Medications - No data to display   Initial Impression / Assessment and Plan / ED Course  I have reviewed the triage vital signs and the nursing notes.  Pertinent labs & imaging results that were available during  my care of the patient were reviewed by me and considered in my medical decision making (see chart for details).     Pt with symptoms for months.  LUE symptoms are reproducable and likely related to radicular pain.  "fluttering" also present for months and described as lasting a few seconds.  Doubt ACS.  Pt is well appearing, no distress.  Appears stable for d/c.  Discussed pt care with Dr. Adriana Simasook.  Pt agrees to PCP f/u, return precautions discussed.     Final Clinical Impressions(s) / ED Diagnoses   Final diagnoses:  Cervical radicular pain    ED Discharge Orders        Ordered    oxyCODONE-acetaminophen (PERCOCET/ROXICET) 5-325 MG  tablet  Every 4 hours PRN     12/29/16 1230    ibuprofen (ADVIL,MOTRIN) 800 MG tablet  3 times daily     12/29/16 1230       Pauline Aus, PA-C 12/31/16 1644    Donnetta Hutching, MD 01/01/17 [phone]

## 2017-02-19 DIAGNOSIS — E1140 Type 2 diabetes mellitus with diabetic neuropathy, unspecified: Secondary | ICD-10-CM | POA: Diagnosis not present

## 2017-02-19 DIAGNOSIS — E119 Type 2 diabetes mellitus without complications: Secondary | ICD-10-CM | POA: Diagnosis not present

## 2017-02-19 DIAGNOSIS — E663 Overweight: Secondary | ICD-10-CM | POA: Diagnosis not present

## 2017-02-19 DIAGNOSIS — E782 Mixed hyperlipidemia: Secondary | ICD-10-CM | POA: Diagnosis not present

## 2017-02-19 DIAGNOSIS — I10 Essential (primary) hypertension: Secondary | ICD-10-CM | POA: Diagnosis not present

## 2017-02-19 DIAGNOSIS — Z6828 Body mass index (BMI) 28.0-28.9, adult: Secondary | ICD-10-CM | POA: Diagnosis not present

## 2017-02-23 ENCOUNTER — Encounter: Payer: Self-pay | Admitting: Gastroenterology

## 2017-03-11 ENCOUNTER — Ambulatory Visit: Payer: Medicare PPO

## 2017-03-11 ENCOUNTER — Telehealth: Payer: Self-pay | Admitting: *Deleted

## 2017-03-11 ENCOUNTER — Encounter: Payer: Self-pay | Admitting: *Deleted

## 2017-03-11 NOTE — Telephone Encounter (Signed)
PATIENT WAS A NO SHOW AND LETTER SENT  °

## 2017-03-11 NOTE — Telephone Encounter (Signed)
noted 

## 2017-03-15 DIAGNOSIS — E1140 Type 2 diabetes mellitus with diabetic neuropathy, unspecified: Secondary | ICD-10-CM | POA: Diagnosis not present

## 2017-03-19 DIAGNOSIS — Z6828 Body mass index (BMI) 28.0-28.9, adult: Secondary | ICD-10-CM | POA: Diagnosis not present

## 2017-03-19 DIAGNOSIS — J209 Acute bronchitis, unspecified: Secondary | ICD-10-CM | POA: Diagnosis not present

## 2017-03-19 DIAGNOSIS — E663 Overweight: Secondary | ICD-10-CM | POA: Diagnosis not present

## 2017-03-19 DIAGNOSIS — M5010 Cervical disc disorder with radiculopathy, unspecified cervical region: Secondary | ICD-10-CM | POA: Diagnosis not present

## 2017-03-19 DIAGNOSIS — M5020 Other cervical disc displacement, unspecified cervical region: Secondary | ICD-10-CM | POA: Diagnosis not present

## 2017-03-31 DIAGNOSIS — E1140 Type 2 diabetes mellitus with diabetic neuropathy, unspecified: Secondary | ICD-10-CM | POA: Diagnosis not present

## 2017-04-05 ENCOUNTER — Other Ambulatory Visit (HOSPITAL_COMMUNITY): Payer: Self-pay | Admitting: Family Medicine

## 2017-04-05 DIAGNOSIS — Z6828 Body mass index (BMI) 28.0-28.9, adult: Secondary | ICD-10-CM | POA: Diagnosis not present

## 2017-04-05 DIAGNOSIS — Z1389 Encounter for screening for other disorder: Secondary | ICD-10-CM | POA: Diagnosis not present

## 2017-04-05 DIAGNOSIS — N492 Inflammatory disorders of scrotum: Secondary | ICD-10-CM | POA: Diagnosis not present

## 2017-04-05 DIAGNOSIS — Z23 Encounter for immunization: Secondary | ICD-10-CM | POA: Diagnosis not present

## 2017-04-05 DIAGNOSIS — S3094XA Unspecified superficial injury of scrotum and testes, initial encounter: Secondary | ICD-10-CM

## 2017-04-05 DIAGNOSIS — E663 Overweight: Secondary | ICD-10-CM | POA: Diagnosis not present

## 2017-04-05 DIAGNOSIS — S3092XS Unspecified superficial injury of abdominal wall, sequela: Secondary | ICD-10-CM | POA: Diagnosis not present

## 2017-04-06 ENCOUNTER — Ambulatory Visit (HOSPITAL_COMMUNITY)
Admission: RE | Admit: 2017-04-06 | Discharge: 2017-04-06 | Disposition: A | Discharge status: Home / Self Care | Payer: Medicare PPO | Source: Ambulatory Visit | Attending: Family Medicine | Admitting: Family Medicine

## 2017-04-06 DIAGNOSIS — S3094XA Unspecified superficial injury of scrotum and testes, initial encounter: Secondary | ICD-10-CM | POA: Diagnosis not present

## 2017-04-06 DIAGNOSIS — N492 Inflammatory disorders of scrotum: Secondary | ICD-10-CM | POA: Diagnosis not present

## 2017-04-06 DIAGNOSIS — X58XXXA Exposure to other specified factors, initial encounter: Secondary | ICD-10-CM | POA: Diagnosis not present

## 2017-04-09 ENCOUNTER — Ambulatory Visit: Payer: Self-pay | Admitting: Urology

## 2017-04-16 ENCOUNTER — Ambulatory Visit: Payer: Medicare PPO | Admitting: Urology

## 2017-04-16 DIAGNOSIS — N499 Inflammatory disorder of unspecified male genital organ: Secondary | ICD-10-CM

## 2017-04-19 ENCOUNTER — Telehealth: Payer: Self-pay | Admitting: *Deleted

## 2017-04-19 ENCOUNTER — Encounter: Payer: Self-pay | Admitting: *Deleted

## 2017-04-19 ENCOUNTER — Ambulatory Visit: Payer: Medicare PPO

## 2017-04-19 NOTE — Telephone Encounter (Signed)
PATIENT WAS A NO SHOW AND LETTER SENT  °

## 2017-04-19 NOTE — Telephone Encounter (Signed)
noted 

## 2017-07-23 ENCOUNTER — Ambulatory Visit: Payer: Self-pay | Admitting: Urology

## 2017-10-22 DIAGNOSIS — E663 Overweight: Secondary | ICD-10-CM | POA: Diagnosis not present

## 2017-10-22 DIAGNOSIS — Z23 Encounter for immunization: Secondary | ICD-10-CM | POA: Diagnosis not present

## 2017-10-22 DIAGNOSIS — E1165 Type 2 diabetes mellitus with hyperglycemia: Secondary | ICD-10-CM | POA: Diagnosis not present

## 2017-10-22 DIAGNOSIS — Z6828 Body mass index (BMI) 28.0-28.9, adult: Secondary | ICD-10-CM | POA: Diagnosis not present

## 2017-10-22 DIAGNOSIS — E782 Mixed hyperlipidemia: Secondary | ICD-10-CM | POA: Diagnosis not present

## 2017-10-22 DIAGNOSIS — Z0001 Encounter for general adult medical examination with abnormal findings: Secondary | ICD-10-CM | POA: Diagnosis not present

## 2017-10-22 DIAGNOSIS — F329 Major depressive disorder, single episode, unspecified: Secondary | ICD-10-CM | POA: Diagnosis not present

## 2017-10-22 DIAGNOSIS — N529 Male erectile dysfunction, unspecified: Secondary | ICD-10-CM | POA: Diagnosis not present

## 2017-10-22 DIAGNOSIS — I10 Essential (primary) hypertension: Secondary | ICD-10-CM | POA: Diagnosis not present

## 2017-10-25 DIAGNOSIS — Z23 Encounter for immunization: Secondary | ICD-10-CM | POA: Diagnosis not present

## 2017-10-25 DIAGNOSIS — Z1389 Encounter for screening for other disorder: Secondary | ICD-10-CM | POA: Diagnosis not present

## 2017-10-25 DIAGNOSIS — E663 Overweight: Secondary | ICD-10-CM | POA: Diagnosis not present

## 2017-10-25 DIAGNOSIS — Z6828 Body mass index (BMI) 28.0-28.9, adult: Secondary | ICD-10-CM | POA: Diagnosis not present

## 2017-11-04 DIAGNOSIS — Z681 Body mass index (BMI) 19 or less, adult: Secondary | ICD-10-CM | POA: Diagnosis not present

## 2017-11-04 DIAGNOSIS — F321 Major depressive disorder, single episode, moderate: Secondary | ICD-10-CM | POA: Diagnosis not present

## 2017-11-30 DIAGNOSIS — E119 Type 2 diabetes mellitus without complications: Secondary | ICD-10-CM | POA: Diagnosis not present

## 2017-11-30 DIAGNOSIS — Z0000 Encounter for general adult medical examination without abnormal findings: Secondary | ICD-10-CM | POA: Diagnosis not present

## 2018-06-29 DIAGNOSIS — Z8639 Personal history of other endocrine, nutritional and metabolic disease: Secondary | ICD-10-CM | POA: Diagnosis not present

## 2018-06-29 DIAGNOSIS — K5900 Constipation, unspecified: Secondary | ICD-10-CM | POA: Diagnosis not present

## 2018-06-29 DIAGNOSIS — Z1211 Encounter for screening for malignant neoplasm of colon: Secondary | ICD-10-CM | POA: Diagnosis not present

## 2018-09-06 DIAGNOSIS — K648 Other hemorrhoids: Secondary | ICD-10-CM | POA: Diagnosis not present

## 2018-09-06 DIAGNOSIS — K621 Rectal polyp: Secondary | ICD-10-CM | POA: Diagnosis not present

## 2018-09-06 DIAGNOSIS — Z1211 Encounter for screening for malignant neoplasm of colon: Secondary | ICD-10-CM | POA: Diagnosis not present

## 2018-09-06 DIAGNOSIS — K635 Polyp of colon: Secondary | ICD-10-CM | POA: Diagnosis not present

## 2018-09-08 DIAGNOSIS — K635 Polyp of colon: Secondary | ICD-10-CM | POA: Diagnosis not present

## 2018-09-08 DIAGNOSIS — K621 Rectal polyp: Secondary | ICD-10-CM | POA: Diagnosis not present

## 2018-10-01 ENCOUNTER — Emergency Department (HOSPITAL_COMMUNITY)
Admission: EM | Admit: 2018-10-01 | Discharge: 2018-10-01 | Disposition: A | Discharge status: Home / Self Care | Payer: Medicare PPO | Attending: Emergency Medicine | Admitting: Emergency Medicine

## 2018-10-01 ENCOUNTER — Encounter (HOSPITAL_COMMUNITY): Payer: Self-pay | Admitting: Emergency Medicine

## 2018-10-01 ENCOUNTER — Other Ambulatory Visit: Payer: Self-pay

## 2018-10-01 ENCOUNTER — Emergency Department (HOSPITAL_COMMUNITY): Payer: Medicare PPO

## 2018-10-01 DIAGNOSIS — M5432 Sciatica, left side: Secondary | ICD-10-CM | POA: Insufficient documentation

## 2018-10-01 DIAGNOSIS — Z79899 Other long term (current) drug therapy: Secondary | ICD-10-CM | POA: Diagnosis not present

## 2018-10-01 DIAGNOSIS — Z7982 Long term (current) use of aspirin: Secondary | ICD-10-CM | POA: Diagnosis not present

## 2018-10-01 DIAGNOSIS — M25552 Pain in left hip: Secondary | ICD-10-CM | POA: Diagnosis not present

## 2018-10-01 DIAGNOSIS — Z794 Long term (current) use of insulin: Secondary | ICD-10-CM | POA: Diagnosis not present

## 2018-10-01 DIAGNOSIS — F17210 Nicotine dependence, cigarettes, uncomplicated: Secondary | ICD-10-CM | POA: Insufficient documentation

## 2018-10-01 DIAGNOSIS — I10 Essential (primary) hypertension: Secondary | ICD-10-CM | POA: Insufficient documentation

## 2018-10-01 DIAGNOSIS — M5442 Lumbago with sciatica, left side: Secondary | ICD-10-CM | POA: Diagnosis not present

## 2018-10-01 DIAGNOSIS — S79912A Unspecified injury of left hip, initial encounter: Secondary | ICD-10-CM | POA: Diagnosis not present

## 2018-10-01 DIAGNOSIS — E119 Type 2 diabetes mellitus without complications: Secondary | ICD-10-CM | POA: Insufficient documentation

## 2018-10-01 DIAGNOSIS — M545 Low back pain: Secondary | ICD-10-CM | POA: Diagnosis not present

## 2018-10-01 LAB — CBG MONITORING, ED: Glucose-Capillary: 182 mg/dL — ABNORMAL HIGH (ref 70–99)

## 2018-10-01 MED ORDER — KETOROLAC TROMETHAMINE 30 MG/ML IJ SOLN
30.0000 mg | Freq: Once | INTRAMUSCULAR | Status: AC
  Administered 2018-10-01: 30 mg via INTRAMUSCULAR
  Filled 2018-10-01: qty 1

## 2018-10-01 MED ORDER — METHOCARBAMOL 500 MG PO TABS: 500.0000 mg | ORAL_TABLET | Freq: Four times a day (QID) | ORAL | 0 refills | Status: DC | PRN

## 2018-10-01 MED ORDER — DIAZEPAM 2 MG PO TABS
2.0000 mg | ORAL_TABLET | Freq: Once | ORAL | Status: AC
  Administered 2018-10-01: 05:00:00 2 mg via ORAL
  Filled 2018-10-01: qty 1

## 2018-10-01 MED ORDER — OXYCODONE-ACETAMINOPHEN 5-325 MG PO TABS
1.0000 | ORAL_TABLET | Freq: Once | ORAL | Status: AC
  Administered 2018-10-01: 1 via ORAL
  Filled 2018-10-01: qty 1

## 2018-10-01 MED ORDER — NAPROXEN 500 MG PO TABS: 500.0000 mg | ORAL_TABLET | Freq: Two times a day (BID) | ORAL | 0 refills | Status: DC

## 2018-10-01 NOTE — Discharge Instructions (Signed)
Take the muscle relaxers and anti-inflammatories as prescribed.  Do not drive or operate heavy machinery when taking the Robaxin.  Your doctor.  Watch your blood sugars closely.  Return to the ED with new or worsening symptoms including weakness, numbness, bowel or bladder incontinence, fever, vomiting or other concerns.

## 2018-10-01 NOTE — ED Notes (Signed)
Pt ambulated around nurses station, limp noted when bearing weight on L leg, pt states he is still in a lot of pain.

## 2018-10-01 NOTE — ED Provider Notes (Signed)
[facility] Provider [facility]   CSN: [id number] Arrival date & time: 10/01/18  0158     History   Chief Complaint Chief Complaint  Patient presents with  . Leg Pain    HPI Nyra Jabsurner M Cromie is a 58 y.o. male.     Patient here with left-sided hip and leg pain radiating down his left leg.  States this started 3 weeks ago when he fell on the side of a hill while weed eating.  He did not have any pain prior to this.  States he has had back surgery in the past and this feels similar to sciatica that he has had previously.  He has been using "ointment at home" but no other medications.  He came in tonight because the pain was worsening.  He denies any focal weakness in the leg.  He has intermittent tingling and numbness but none currently.  No bowel or bladder incontinence.  No fever or vomiting.  Denies any history of IV drug use or cancer.  Last back surgery was about 10 years ago. Denies hitting his head when he fell.  The history is provided by the patient.  Leg Pain Associated symptoms: back pain   Associated symptoms: no fever     Past Medical History:  Diagnosis Date  . Diabetes mellitus without complication (HCC)   . High cholesterol   . Hypertension     There are no active problems to display for this patient.   Past Surgical History:  Procedure Laterality Date  . BACK SURGERY  [phone]   lumbar disk  . OPEN REDUCTION INTERNAL FIXATION (ORIF) SCAPHOID WITH ILIAC CREST BONE GRAFT Left 05/16/2014   Procedure: OPEN REDUCTION INTERNAL FIXATION (ORIF) LEFT SCAPHOID WITH ILIAC CREST BONE GRAFT;  Surgeon: Tarry KosNaiping M Xu, MD;  Location: [facility];  Service: Orthopedics;  Laterality: Left;        Home Medications    Prior to Admission medications   Medication Sig Start Date End Date Taking? Authorizing Provider  amLODipine (NORVASC) 5 MG tablet Take 5 mg by mouth daily.    [provider]  aspirin 325 MG tablet Take 325 mg by  mouth daily.    [provider]  benazepril (LOTENSIN) 40 MG tablet Take 40 mg by mouth daily.    [provider]  Canagliflozin-Metformin HCl ER (INVOKAMET XR) 50-1000 MG TB24 Take by mouth.    [provider]  etodolac (LODINE) 400 MG tablet Take 1 tablet 2 (two) times daily by mouth. 12/14/16   [provider]  furosemide (LASIX) 20 MG tablet Take 1 tablet daily by mouth. 12/14/16   [provider]  glipiZIDE (GLUCOTROL XL) 10 MG 24 hr tablet Take 1 tablet daily by mouth. 12/02/16   [provider]  ibuprofen (ADVIL,MOTRIN) 800 MG tablet Take 1 tablet (800 mg total) 3 (three) times daily by mouth. Take with food 12/29/16   Triplett, Tammy, PA-C  Insulin Detemir (LEVEMIR FLEXPEN) 100 UNIT/ML Pen Inject 20 Units into the skin daily at 10 pm.    [provider]  liraglutide (VICTOZA) 18 MG/3ML SOPN Inject 1.2 mg daily into the skin. 11/16/16   [provider]  oxyCODONE-acetaminophen (PERCOCET/ROXICET) 5-325 MG tablet Take 1 tablet every 4 (four) hours as needed by mouth. 12/29/16   Triplett, Tammy, PA-C    Family History Family History  Problem Relation Age of Onset  . Hypertension Mother   . Diabetes Mother   . Hypertension Father   .  Diabetes Father   . Diabetes Sister     Social History Social History   Tobacco Use  . Smoking status: Current Every Day Smoker    Packs/day: 1.00    Types: Cigarettes  . Smokeless tobacco: Never Used  Substance Use Topics  . Alcohol use: No  . Drug use: No     Allergies   Patient has no known allergies.   Review of Systems Review of Systems  Constitutional: Negative for activity change, appetite change and fever.  HENT: Negative for congestion and rhinorrhea.   Eyes: Negative for visual disturbance.  Respiratory: Negative for chest tightness and shortness of breath.   Cardiovascular: Negative for chest pain.  Gastrointestinal: Negative for abdominal pain, nausea and  vomiting.  Genitourinary: Negative for dysuria and hematuria.  Musculoskeletal: Positive for arthralgias, back pain and myalgias.  Skin: Negative for rash.  Neurological: Negative for dizziness, weakness and headaches.    all other systems are negative except as noted in the HPI and PMH.    Physical Exam Updated Vital Signs BP (!) 147/86 (BP Location: Left Arm)   Pulse 92   Temp 98.1 F (36.7 C) (Oral)   Resp 16   Ht 5\' 9"  (1.753 m)   Wt 96.2 kg   SpO2 99%   BMI 31.31 kg/m   Physical Exam Vitals signs and nursing note reviewed.  Constitutional:      General: He is not in acute distress.    Appearance: He is well-developed.  HENT:     Head: Normocephalic and atraumatic.     Mouth/Throat:     Pharynx: No oropharyngeal exudate.  Eyes:     Conjunctiva/sclera: Conjunctivae normal.     Pupils: Pupils are equal, round, and reactive to light.  Neck:     Musculoskeletal: Normal range of motion and neck supple.     Comments: No meningismus. Cardiovascular:     Rate and Rhythm: Normal rate and regular rhythm.     Heart sounds: Normal heart sounds. No murmur.  Pulmonary:     Effort: Pulmonary effort is normal. No respiratory distress.     Breath sounds: Normal breath sounds.  Chest:     Chest wall: No tenderness.  Abdominal:     Palpations: Abdomen is soft.     Tenderness: There is no abdominal tenderness. There is no guarding or rebound.  Musculoskeletal: Normal range of motion.        General: Tenderness present.     Comments: No lumbar spine tenderness L SI joint tenderness.  5/5 strength in bilateral lower extremities. Ankle plantar and dorsiflexion intact. Great toe extension intact bilaterally. +2 DP and PT pulses. +2 patellar reflexes bilaterally. Antalgic gait.  Compartments soft  Skin:    General: Skin is warm.     Capillary Refill: Capillary refill takes less than 2 seconds.  Neurological:     General: No focal deficit present.     Mental Status: He is alert  and oriented to person, place, and time. Mental status is at baseline.     Cranial Nerves: No cranial nerve deficit.     Motor: No abnormal muscle tone.     Coordination: Coordination normal.     Comments:  5/5 strength throughout. CN 2-12 intact.Equal grip strength.   Psychiatric:        Behavior: Behavior normal.      ED Treatments / Results  Labs (all labs ordered are listed, but only abnormal results are displayed) Labs Reviewed  CBG MONITORING,  ED - Abnormal; Notable for the following components:      Result Value   Glucose-Capillary 182 (*)    All other components within normal limits    EKG None  Radiology Dg Lumbar Spine Complete  Result Date: 10/01/2018 CLINICAL DATA:  Low back pain radiating to left hip for 3 weeks. Fall 3 weeks ago. EXAM: LUMBAR SPINE - COMPLETE 4+ VIEW COMPARISON:  None. FINDINGS: There is no evidence of lumbar spine fracture. Alignment is normal. Severe degenerative disc disease with vacuum disc phenomenon at L4. Mild degenerative disc disease at L5. Mild bilateral facet DJD from L3 through S1. No focal lytic or sclerotic bone lesions identified. Aortic atherosclerosis. IMPRESSION: 1. No acute findings. 2. Degenerative spondylosis, as described above. Electronically Signed   By: Danae OrleansJohn A Stahl M.D.   On: 10/01/2018 04:59   Dg Hip Unilat W Or Wo Pelvis 2-3 Views Left  Result Date: 10/01/2018 CLINICAL DATA:  Fall 3 weeks ago. Left hip pain.  Initial encounter. EXAM: DG HIP (WITH OR WITHOUT PELVIS) 2-3V LEFT COMPARISON:  None. FINDINGS: There is no evidence of hip fracture or dislocation. Mild degenerative spurring of the acetabulum noted without evidence of joint space narrowing. No other bone lesions identified. IMPRESSION: 1. No acute findings. 2. Mild left hip osteoarthritis. Electronically Signed   By: Danae OrleansJohn A Stahl M.D.   On: 10/01/2018 05:03    Procedures Procedures (including critical care time)  Medications Ordered in ED Medications   oxyCODONE-acetaminophen (PERCOCET/ROXICET) 5-325 MG per tablet 1 tablet (has no administration in time range)  diazepam (VALIUM) tablet 2 mg (has no administration in time range)  ketorolac (TORADOL) 30 MG/ML injection 30 mg (has no administration in time range)     Initial Impression / Assessment and Plan / ED Course  I have reviewed the triage vital signs and the nursing notes.  Pertinent labs & imaging results that were available during my care of the patient were reviewed by me and considered in my medical decision making (see chart for details).       Left-sided sciatica without red flags.  Intact distal strength, sensation, pulses and reflexes.  Antalgic gait. X-ray will be will be obtained given history of trauma.  X-rays are negative.  Patient given IM Toradol as well as p.o. Percocet and Valium. CBG 182.  Low suspicion for cord compression or cauda equina.  No focal weakness.  No numbness or tingling.  No pulse deficit.  Normal reflexes.  We will treat supportively for suspected sciatica.  Will avoid steroids given his history of diabetes.  Will treat with muscle relaxers and anti-inflammatories. Follow-up with PCP.  Return precautions discussed.  Final Clinical Impressions(s) / ED Diagnoses   Final diagnoses:  Sciatica, left side    ED Discharge Orders    None       Lyndsee Casa, Jeannett SeniorStephen, MD 10/01/18 [phone]

## 2018-10-01 NOTE — ED Triage Notes (Signed)
Pt arrives via POV w/complaints of L hip pain that radiates down L leg. Pt states symptoms started aprox 3 weeks ago. Pt states he slipped while weed eating 3 weeks ago, began to hurt 2 days post fall.

## 2018-10-03 DIAGNOSIS — M5410 Radiculopathy, site unspecified: Secondary | ICD-10-CM | POA: Diagnosis not present

## 2018-10-03 DIAGNOSIS — Z6828 Body mass index (BMI) 28.0-28.9, adult: Secondary | ICD-10-CM | POA: Diagnosis not present

## 2018-10-03 DIAGNOSIS — E119 Type 2 diabetes mellitus without complications: Secondary | ICD-10-CM | POA: Diagnosis not present

## 2018-10-03 DIAGNOSIS — E663 Overweight: Secondary | ICD-10-CM | POA: Diagnosis not present

## 2018-10-03 DIAGNOSIS — M545 Low back pain: Secondary | ICD-10-CM | POA: Diagnosis not present

## 2018-10-03 LAB — HEMOGLOBIN A1C: Hemoglobin A1C: 9.5

## 2018-10-20 DIAGNOSIS — Z23 Encounter for immunization: Secondary | ICD-10-CM | POA: Diagnosis not present

## 2018-10-20 DIAGNOSIS — M545 Low back pain: Secondary | ICD-10-CM | POA: Diagnosis not present

## 2018-10-20 DIAGNOSIS — E663 Overweight: Secondary | ICD-10-CM | POA: Diagnosis not present

## 2018-10-20 DIAGNOSIS — M5410 Radiculopathy, site unspecified: Secondary | ICD-10-CM | POA: Diagnosis not present

## 2018-10-20 DIAGNOSIS — Z6828 Body mass index (BMI) 28.0-28.9, adult: Secondary | ICD-10-CM | POA: Diagnosis not present

## 2018-10-27 ENCOUNTER — Other Ambulatory Visit: Payer: Self-pay | Admitting: Family Medicine

## 2018-10-27 DIAGNOSIS — M545 Low back pain, unspecified: Secondary | ICD-10-CM

## 2018-10-27 DIAGNOSIS — M5410 Radiculopathy, site unspecified: Secondary | ICD-10-CM

## 2018-11-06 ENCOUNTER — Ambulatory Visit
Admission: RE | Admit: 2018-11-06 | Discharge: 2018-11-06 | Disposition: A | Discharge status: Home / Self Care | Payer: Medicare PPO | Source: Ambulatory Visit | Attending: Family Medicine | Admitting: Family Medicine

## 2018-11-06 ENCOUNTER — Other Ambulatory Visit: Payer: Self-pay

## 2018-11-06 DIAGNOSIS — M5410 Radiculopathy, site unspecified: Secondary | ICD-10-CM

## 2018-11-06 DIAGNOSIS — M48061 Spinal stenosis, lumbar region without neurogenic claudication: Secondary | ICD-10-CM | POA: Diagnosis not present

## 2018-11-06 DIAGNOSIS — M545 Low back pain, unspecified: Secondary | ICD-10-CM

## 2018-11-15 DIAGNOSIS — Z1389 Encounter for screening for other disorder: Secondary | ICD-10-CM | POA: Diagnosis not present

## 2018-11-15 DIAGNOSIS — I10 Essential (primary) hypertension: Secondary | ICD-10-CM | POA: Diagnosis not present

## 2018-11-15 DIAGNOSIS — Z0001 Encounter for general adult medical examination with abnormal findings: Secondary | ICD-10-CM | POA: Diagnosis not present

## 2018-11-15 DIAGNOSIS — E7849 Other hyperlipidemia: Secondary | ICD-10-CM | POA: Diagnosis not present

## 2018-11-15 DIAGNOSIS — E663 Overweight: Secondary | ICD-10-CM | POA: Diagnosis not present

## 2018-11-15 DIAGNOSIS — E119 Type 2 diabetes mellitus without complications: Secondary | ICD-10-CM | POA: Diagnosis not present

## 2018-11-15 DIAGNOSIS — Z6828 Body mass index (BMI) 28.0-28.9, adult: Secondary | ICD-10-CM | POA: Diagnosis not present

## 2018-11-16 DIAGNOSIS — E1140 Type 2 diabetes mellitus with diabetic neuropathy, unspecified: Secondary | ICD-10-CM | POA: Diagnosis not present

## 2018-11-25 DIAGNOSIS — M4726 Other spondylosis with radiculopathy, lumbar region: Secondary | ICD-10-CM | POA: Diagnosis not present

## 2018-12-07 ENCOUNTER — Ambulatory Visit: Payer: Self-pay | Admitting: "Endocrinology

## 2018-12-20 ENCOUNTER — Encounter: Payer: Self-pay | Admitting: "Endocrinology

## 2018-12-20 ENCOUNTER — Ambulatory Visit (INDEPENDENT_AMBULATORY_CARE_PROVIDER_SITE_OTHER): Payer: Medicare PPO | Admitting: "Endocrinology

## 2018-12-20 ENCOUNTER — Other Ambulatory Visit: Payer: Self-pay

## 2018-12-20 VITALS — BP 132/85 | HR 87 | Ht 70.0 in | Wt 196.0 lb

## 2018-12-20 DIAGNOSIS — I10 Essential (primary) hypertension: Secondary | ICD-10-CM | POA: Diagnosis not present

## 2018-12-20 DIAGNOSIS — E782 Mixed hyperlipidemia: Secondary | ICD-10-CM | POA: Diagnosis not present

## 2018-12-20 DIAGNOSIS — E1165 Type 2 diabetes mellitus with hyperglycemia: Secondary | ICD-10-CM | POA: Diagnosis not present

## 2018-12-20 MED ORDER — METFORMIN HCL 1000 MG PO TABS: 500.0000 mg | ORAL_TABLET | Freq: Two times a day (BID) | ORAL | 3 refills | Status: DC

## 2018-12-20 MED ORDER — ATORVASTATIN CALCIUM 20 MG PO TABS
20.0000 mg | ORAL_TABLET | Freq: Every day | ORAL | 2 refills | Status: DC
Start: 2018-12-20 — End: 2023-06-24

## 2018-12-20 MED ORDER — ACCU-CHEK GUIDE VI STRP: ORAL_STRIP | 2 refills | Status: DC

## 2018-12-20 NOTE — Progress Notes (Signed)
Endocrinology Consult Note       12/20/2018, 12:37 PM   Subjective:    Patient ID: Scott Griffin, male    DOB: 28-Feb-1960.  Scott Griffin is being seen in consultation for management of currently uncontrolled symptomatic diabetes requested by  Elfredia Nevins, MD.   Past Medical History:  Diagnosis Date  . Diabetes mellitus without complication (HCC)   . High cholesterol   . Hypertension     Past Surgical History:  Procedure Laterality Date  . BACK SURGERY  [phone]   lumbar disk  . OPEN REDUCTION INTERNAL FIXATION (ORIF) SCAPHOID WITH ILIAC CREST BONE GRAFT Left 05/16/2014   Procedure: OPEN REDUCTION INTERNAL FIXATION (ORIF) LEFT SCAPHOID WITH ILIAC CREST BONE GRAFT;  Surgeon: Tarry Kos, MD;  Location: [facility];  Service: Orthopedics;  Laterality: Left;    Social History   Socioeconomic History  . Marital status: Married    Spouse name: Not on file  . Number of children: Not on file  . Years of education: Not on file  . Highest education level: Not on file  Occupational History  . Not on file  Social Needs  . Financial resource strain: Not on file  . Food insecurity    Worry: Not on file    Inability: Not on file  . Transportation needs    Medical: Not on file    Non-medical: Not on file  Tobacco Use  . Smoking status: Current Every Day Smoker    Packs/day: 1.00    Types: Cigarettes  . Smokeless tobacco: Never Used  Substance and Sexual Activity  . Alcohol use: No  . Drug use: No  . Sexual activity: Not on file  Lifestyle  . Physical activity    Days per week: Not on file    Minutes per session: Not on file  . Stress: Not on file  Relationships  . Social Musician on phone: Not on file    Gets together: Not on file    Attends religious service: Not on file    Active member of club or organization: Not on file    Attends meetings of  clubs or organizations: Not on file    Relationship status: Not on file  Other Topics Concern  . Not on file  Social History Narrative  . Not on file    Family History  Problem Relation Age of Onset  . Hypertension Mother   . Diabetes Mother   . Hypertension Father   . Diabetes Father   . Diabetes Sister     Outpatient Encounter Medications as of 12/20/2018  Medication Sig  . amLODipine (NORVASC) 5 MG tablet Take 5 mg by mouth daily.  Marland Kitchen aspirin 325 MG tablet Take 325 mg by mouth daily.  . benazepril (LOTENSIN) 40 MG tablet Take 40 mg by mouth daily.  Marland Kitchen glipiZIDE (GLUCOTROL XL) 10 MG 24 hr tablet Take 5 mg by mouth daily with breakfast.  . liraglutide (VICTOZA) 18 MG/3ML SOPN Inject 1.8 mg into the skin daily.   Marland Kitchen oxyCODONE-acetaminophen (PERCOCET/ROXICET) 5-325 MG tablet Take 1 tablet every 4 (  four) hours as needed by mouth.  . [DISCONTINUED] Canagliflozin-Metformin HCl ER (INVOKAMET XR) 50-1000 MG TB24 Take by mouth.  Marland Kitchen. atorvastatin (LIPITOR) 20 MG tablet Take 1 tablet (20 mg total) by mouth daily.  Marland Kitchen. glucose blood (ACCU-CHEK GUIDE) test strip Use as instructed  . metFORMIN (GLUCOPHAGE) 1000 MG tablet Take 0.5 tablets (500 mg total) by mouth 2 (two) times daily with a meal.  . [DISCONTINUED] etodolac (LODINE) 400 MG tablet Take 1 tablet 2 (two) times daily by mouth.  . [DISCONTINUED] furosemide (LASIX) 20 MG tablet Take 1 tablet daily by mouth.  . [DISCONTINUED] ibuprofen (ADVIL,MOTRIN) 800 MG tablet Take 1 tablet (800 mg total) 3 (three) times daily by mouth. Take with food  . [DISCONTINUED] Insulin Detemir (LEVEMIR FLEXPEN) 100 UNIT/ML Pen Inject 20 Units into the skin daily at 10 pm.  . [DISCONTINUED] methocarbamol (ROBAXIN) 500 MG tablet Take 1 tablet (500 mg total) by mouth every 6 (six) hours as needed for muscle spasms.  . [DISCONTINUED] naproxen (NAPROSYN) 500 MG tablet Take 1 tablet (500 mg total) by mouth 2 (two) times daily with a meal.   No facility-administered  encounter medications on file as of 12/20/2018.     ALLERGIES: No Known Allergies  VACCINATION STATUS: Immunization History  Administered Date(s) Administered  . Tdap 01/05/2014    Diabetes He presents for his initial diabetic visit. He has type 2 diabetes mellitus. Onset time: He was diagnosed at approximate age of 43 years. His disease course has been worsening. There are no hypoglycemic associated symptoms. Pertinent negatives for hypoglycemia include no confusion, headaches, pallor or seizures. Associated symptoms include polydipsia and polyuria. Pertinent negatives for diabetes include no chest pain, no fatigue, no polyphagia and no weakness. There are no hypoglycemic complications. Symptoms are worsening. There are no diabetic complications. Risk factors for coronary artery disease include diabetes mellitus, dyslipidemia, family history, male sex, sedentary lifestyle, tobacco exposure and hypertension. Current diabetic treatments: He is currently on Victoza 1.8 mg daily, Invokamet 150/1000 mg p.o. twice daily, glipizide 10 mg p.o. daily. His weight is fluctuating minimally. He is following a generally unhealthy diet. When asked about meal planning, he reported none. He has not had a previous visit with a dietitian. He never participates in exercise. (He did not bring any logs nor meter to review.  His A1c was 9.5% in August 2020.) An ACE inhibitor/angiotensin II receptor blocker is being taken.  Hyperlipidemia This is a chronic problem. The current episode started more than 1 year ago. The problem is controlled. Exacerbating diseases include diabetes. Pertinent negatives include no chest pain, myalgias or shortness of breath. Current antihyperlipidemic treatment includes statins. Risk factors for coronary artery disease include dyslipidemia, diabetes mellitus, family history, male sex, hypertension and a sedentary lifestyle.  Hypertension This is a chronic problem. The current episode started  more than 1 year ago. Pertinent negatives include no chest pain, headaches, neck pain, palpitations or shortness of breath. Risk factors for coronary artery disease include diabetes mellitus, dyslipidemia, family history, male gender, sedentary lifestyle and smoking/tobacco exposure. Past treatments include ACE inhibitors.     Review of Systems  Constitutional: Negative for chills, fatigue, fever and unexpected weight change.  HENT: Negative for dental problem, mouth sores and trouble swallowing.   Eyes: Negative for visual disturbance.  Respiratory: Negative for cough, choking, chest tightness, shortness of breath and wheezing.   Cardiovascular: Negative for chest pain, palpitations and leg swelling.  Gastrointestinal: Negative for abdominal distention, abdominal pain, constipation, diarrhea, nausea and vomiting.  Endocrine: Positive for polydipsia and polyuria. Negative for polyphagia.  Genitourinary: Negative for dysuria, flank pain, hematuria and urgency.  Musculoskeletal: Negative for back pain, gait problem, myalgias and neck pain.  Skin: Negative for pallor, rash and wound.  Neurological: Negative for seizures, syncope, weakness, numbness and headaches.  Psychiatric/Behavioral: Negative for confusion and dysphoric mood.    Objective:    BP 132/85   Pulse 87   Ht 5\' 10"  (1.778 m)   Wt 196 lb (88.9 kg)   BMI 28.12 kg/m   Wt Readings from Last 3 Encounters:  12/20/18 196 lb (88.9 kg)  10/01/18 212 lb (96.2 kg)  12/29/16 200 lb (90.7 kg)     Physical Exam Constitutional:      General: He is not in acute distress.    Appearance: He is well-developed.  HENT:     Head: Normocephalic and atraumatic.  Neck:     Musculoskeletal: Normal range of motion and neck supple.     Thyroid: No thyromegaly.     Trachea: No tracheal deviation.  Cardiovascular:     Rate and Rhythm: Normal rate.     Pulses:          Dorsalis pedis pulses are 1+ on the right side and 1+ on the left side.        Posterior tibial pulses are 1+ on the right side and 1+ on the left side.     Heart sounds: S1 normal and S2 normal. No murmur. No gallop.   Pulmonary:     Effort: Pulmonary effort is normal. No respiratory distress.     Breath sounds: No wheezing.  Abdominal:     General: There is no distension.     Tenderness: There is no abdominal tenderness. There is no guarding.  Musculoskeletal:     Right shoulder: He exhibits no swelling and no deformity.  Skin:    General: Skin is warm and dry.     Findings: No rash.     Nails: There is no clubbing.   Neurological:     Mental Status: He is alert and oriented to person, place, and time.     Cranial Nerves: No cranial nerve deficit.     Sensory: No sensory deficit.     Gait: Gait normal.     Deep Tendon Reflexes: Reflexes are normal and symmetric.  Psychiatric:        Speech: Speech normal.        Behavior: Behavior normal. Behavior is cooperative.        Thought Content: Thought content normal.        Judgment: Judgment normal.       CMP ( most recent) CMP     Component Value Date/Time   NA 137 12/29/2016 1015   K 4.5 12/29/2016 1015   CL 101 12/29/2016 1015   CO2 26 12/29/2016 1015   GLUCOSE 126 (H) 12/29/2016 1015   BUN 12 12/29/2016 1015   CREATININE 1.03 12/29/2016 1015   CALCIUM 10.0 12/29/2016 1015   GFRNONAA >60 12/29/2016 1015   GFRAA >60 12/29/2016 1015     Diabetic Labs (most recent): Lab Results  Component Value Date   HGBA1C 9.5 10/03/2018    Assessment & Plan:   1. Uncontrolled type 2 diabetes mellitus with hyperglycemia (HCC) - Scott Griffin has currently uncontrolled symptomatic type 2 DM since  58 years of age,  with most recent A1c of 9.5 %. Recent labs reviewed. - I had a long discussion with  him about the progressive nature of diabetes and the pathology behind its complications. -his diabetes is complicated by chronic heavy smoking, sedentary life and he remains at a high risk for more  acute and chronic complications which include CAD, CVA, CKD, retinopathy, and neuropathy. These are all discussed in detail with him.  - I have counseled him on diet  and weight management  by adopting a carbohydrate restricted/protein rich diet. Patient is encouraged to switch to  unprocessed or minimally processed     complex starch and increased protein intake (animal or plant source), fruits, and vegetables. -  he is advised to stick to a routine mealtimes to eat 3 meals  a day and avoid unnecessary snacks ( to snack only to correct hypoglycemia).   - he admits that there is a room for improvement in his food and drink choices. - Suggestion is made for him to avoid simple carbohydrates  from his diet including Cakes, Sweet Desserts, Ice Cream, Soda (diet and regular), Sweet Tea, Candies, Chips, Cookies, Store Bought Juices, Alcohol in Excess of  1-2 drinks a day, Artificial Sweeteners,  Coffee Creamer, and "Sugar-free" Products. This will help patient to have more stable blood glucose profile and potentially avoid unintended weight gain.  - he will be scheduled with Jearld Fenton, RDN, CDE for diabetes education.  - I have approached him with the following individualized plan to manage  his diabetes and patient agrees:   -He may need insulin treatment in order for him to achieve and maintain control of diabetes to target. -In preparation, he is approached to start monitoring blood glucose 4 times a day  -before meals and at bedtime and he will be on telephone visit 10 days from now.  - he is encouraged to call clinic for blood glucose levels less than 70 or above 300 mg /dl. - he is advised to continue Victoza 1.8 mg subcutaneously daily, therapeutically suitable for patient . - his Invokamet will be discontinued, risk outweighs benefit for this patient. -He will be continued on metformin 1000 mg p.o. twice daily. -He is advised to cut his glipizide in half and take 5 mg XL p.o. daily at  breakfast.   - Specific targets for  A1c;  LDL, HDL, Triglycerides, and  Waist Circumference were discussed with the patient.  2) Blood Pressure /Hypertension:  his blood pressure is  controlled to target.   he is advised to continue his current medications including benazepril 40 mg p.o. daily with breakfast . 3) Lipids/Hyperlipidemia:   Review of his recent lipid panel showed controlled  LDL at 771 .  However, he would benefit from statin treatment, discussed and initiated atorvastatin 20 mg p.o. nightly.  Side effects and precautions discussed with him.  4)  Weight/Diet:  Body mass index is 28.12 kg/m.  -   he is not a candidate for  Major weight loss. I discussed with him the fact that loss of 5 - 10% of his  current body weight will have the most impact on his diabetes management.  Exercise, and detailed carbohydrates information provided  -  detailed on discharge instructions.  5) Chronic Care/Health Maintenance:  -he  is on ACEI/ARB and Statin medications and  is encouraged to initiate and continue to follow up with Ophthalmology, Dentist,  Podiatrist at least yearly or according to recommendations, and advised to  Quit smoking. I have recommended yearly flu vaccine and pneumonia vaccine at least every 5 years; moderate intensity exercise for up to  150 minutes weekly; and  sleep for at least 7 hours a day.  -She is extensively counseled for smoking cessation.  - he is  advised to maintain close follow up with Elfredia Nevins, MD for primary care needs, as well as his other providers for optimal and coordinated care.  - Time spent with the patient:  45 minutes, of which >50% was spent in obtaining information about his symptoms, reviewing his previous labs/studies, evaluations, and treatments, counseling him about his currently uncontrolled type 2 diabetes, hyperlipidemia, hypertension, and developing plans for long term treatment based on the latest standards of care/guidelines.  Please  refer to " Patient Self Inventory" in the Media  tab for reviewed elements of pertinent patient history.  Nyra Jabs participated in the discussions, expressed understanding, and voiced agreement with the above plans.  All questions were answered to his satisfaction. he is encouraged to contact clinic should he have any questions or concerns prior to his return visit.  Follow up plan: - Return in about 10 days (around 12/30/2018) for Follow up with Meter and Logs Only - no Labs.  Marquis Lunch, MD [facility] [address] [place], [place] [address] Phone: [phone]  Fax: [phone]    12/20/2018, 12:37 PM  This note was partially dictated with voice recognition software. Similar sounding words can be transcribed inadequately or may not  be corrected upon review.

## 2018-12-20 NOTE — Patient Instructions (Signed)

## 2018-12-26 ENCOUNTER — Other Ambulatory Visit: Payer: Self-pay

## 2019-01-02 ENCOUNTER — Encounter: Payer: Self-pay | Admitting: "Endocrinology

## 2019-01-02 ENCOUNTER — Ambulatory Visit (INDEPENDENT_AMBULATORY_CARE_PROVIDER_SITE_OTHER): Payer: Medicare PPO | Admitting: "Endocrinology

## 2019-01-02 ENCOUNTER — Other Ambulatory Visit: Payer: Self-pay

## 2019-01-02 DIAGNOSIS — E1165 Type 2 diabetes mellitus with hyperglycemia: Secondary | ICD-10-CM

## 2019-01-02 DIAGNOSIS — E782 Mixed hyperlipidemia: Secondary | ICD-10-CM | POA: Diagnosis not present

## 2019-01-02 DIAGNOSIS — I10 Essential (primary) hypertension: Secondary | ICD-10-CM | POA: Diagnosis not present

## 2019-01-02 MED ORDER — ACCU-CHEK MULTICLIX LANCETS MISC: 3 refills | Status: DC

## 2019-01-02 NOTE — Progress Notes (Addendum)
01/02/2019, 9:06 AM                                                    Endocrinology Telehealth Visit Follow up Note -During COVID -19 Pandemic  This visit type was conducted due to national recommendations for restrictions regarding the COVID-19 Pandemic  in an effort to limit this patient's exposure and mitigate transmission of the corona virus.  Due to his co-morbid illnesses, Scott Griffin is at  moderate to high risk for complications without adequate follow up.  This format is felt to be most appropriate for him at this time.  I connected with this patient on 01/02/19  by telephone and verified that I am speaking with the correct person using two identifiers. Scott Griffin, 10-13-1960. he has verbally consented to this visit. All issues noted in this document were discussed and addressed. The format was not optimal for physical exam.    Subjective:    Patient ID: Scott Griffin, male    DOB: 02/07/1961.  Scott Griffin is being engaged in telehealth via telephone after he was seen in consultation for management of currently uncontrolled symptomatic diabetes requested by  Redmond School, MD.   Past Medical History:  Diagnosis Date  . Diabetes mellitus without complication ([place])   . High cholesterol   . Hypertension     Past Surgical History:  Procedure Laterality Date  . BACK SURGERY  [phone]   lumbar disk  . OPEN REDUCTION INTERNAL FIXATION (ORIF) SCAPHOID WITH ILIAC CREST BONE GRAFT Left 05/16/2014   Procedure: OPEN REDUCTION INTERNAL FIXATION (ORIF) LEFT SCAPHOID WITH ILIAC CREST BONE GRAFT;  Surgeon: Leandrew Koyanagi, MD;  Location: [facility];  Service: Orthopedics;  Laterality: Left;    Social History   Socioeconomic History  . Marital status: Married    Spouse name: Not on file  . Number of children: Not on file  . Years of education: Not on file  . Highest  education level: Not on file  Occupational History  . Not on file  Social Needs  . Financial resource strain: Not on file  . Food insecurity    Worry: Not on file    Inability: Not on file  . Transportation needs    Medical: Not on file    Non-medical: Not on file  Tobacco Use  . Smoking status: Current Every Day Smoker    Packs/day: 1.00    Types: Cigarettes  . Smokeless tobacco: Never Used  Substance and Sexual Activity  . Alcohol use: No  . Drug use: No  . Sexual activity: Not on file  Lifestyle  . Physical activity    Days per week: Not on file    Minutes per session: Not on file  . Stress: Not on file  Relationships  . Social Herbalist on phone: Not on file    Gets together: Not on file    Attends religious service: Not on file  Active member of club or organization: Not on file    Attends meetings of clubs or organizations: Not on file    Relationship status: Not on file  Other Topics Concern  . Not on file  Social History Narrative  . Not on file    Family History  Problem Relation Age of Onset  . Hypertension Mother   . Diabetes Mother   . Hypertension Father   . Diabetes Father   . Diabetes Sister     Outpatient Encounter Medications as of 01/02/2019  Medication Sig  . amLODipine (NORVASC) 5 MG tablet Take 5 mg by mouth daily.  Marland Kitchen aspirin 325 MG tablet Take 325 mg by mouth daily.  Marland Kitchen atorvastatin (LIPITOR) 20 MG tablet Take 1 tablet (20 mg total) by mouth daily.  . benazepril (LOTENSIN) 40 MG tablet Take 40 mg by mouth daily.  Marland Kitchen glipiZIDE (GLUCOTROL XL) 10 MG 24 hr tablet Take 5 mg by mouth daily with breakfast.  . glucose blood (ACCU-CHEK GUIDE) test strip Use as instructed  . Lancets (ACCU-CHEK MULTICLIX) lancets Use as instructed  . liraglutide (VICTOZA) 18 MG/3ML SOPN Inject 1.8 mg into the skin daily.   . metFORMIN (GLUCOPHAGE) 1000 MG tablet Take 0.5 tablets (500 mg total) by mouth 2 (two) times daily with a meal.  .  oxyCODONE-acetaminophen (PERCOCET/ROXICET) 5-325 MG tablet Take 1 tablet every 4 (four) hours as needed by mouth.   No facility-administered encounter medications on file as of 01/02/2019.     ALLERGIES: No Known Allergies  VACCINATION STATUS: Immunization History  Administered Date(s) Administered  . Tdap 01/05/2014    Diabetes He presents for his follow-up diabetic visit. He has type 2 diabetes mellitus. Onset time: He was diagnosed at approximate age of 43 years. His disease course has been improving. There are no hypoglycemic associated symptoms. Pertinent negatives for hypoglycemia include no confusion, headaches, pallor or seizures. Pertinent negatives for diabetes include no chest pain, no fatigue, no polydipsia, no polyphagia, no polyuria and no weakness. There are no hypoglycemic complications. Symptoms are improving. There are no diabetic complications. Risk factors for coronary artery disease include diabetes mellitus, dyslipidemia, family history, male sex, sedentary lifestyle, tobacco exposure and hypertension. Current diabetic treatments: He is currently on Victoza 1.8 mg daily, Invokamet 150/1000 mg p.o. twice daily, glipizide 10 mg p.o. daily. His weight is fluctuating minimally. He is following a generally unhealthy diet. When asked about meal planning, he reported none. He has not had a previous visit with a dietitian. He never participates in exercise. His breakfast blood glucose range is generally 130-140 mg/dl. His lunch blood glucose range is generally 130-140 mg/dl. His dinner blood glucose range is generally 130-140 mg/dl. His bedtime blood glucose range is generally 130-140 mg/dl. His overall blood glucose range is 130-140 mg/dl. (He reports controlled glycemic profile, both fasting and post prandial. His A1c was 9.5% in August 2020.) An ACE inhibitor/angiotensin II receptor blocker is being taken.  Hyperlipidemia This is a chronic problem. The current episode started more  than 1 year ago. The problem is controlled. Exacerbating diseases include diabetes. Pertinent negatives include no chest pain, myalgias or shortness of breath. Current antihyperlipidemic treatment includes statins. Risk factors for coronary artery disease include dyslipidemia, diabetes mellitus, family history, male sex, hypertension and a sedentary lifestyle.  Hypertension This is a chronic problem. The current episode started more than 1 year ago. Pertinent negatives include no chest pain, headaches, neck pain, palpitations or shortness of breath. Risk factors for coronary artery  disease include diabetes mellitus, dyslipidemia, family history, male gender, sedentary lifestyle and smoking/tobacco exposure. Past treatments include ACE inhibitors.    Review of systems: Limited as above   Objective:    There were no vitals taken for this visit.  Wt Readings from Last 3 Encounters:  12/20/18 196 lb (88.9 kg)  10/01/18 212 lb (96.2 kg)  12/29/16 200 lb (90.7 kg)        CMP ( most recent) CMP     Component Value Date/Time   NA 137 12/29/2016 1015   K 4.5 12/29/2016 1015   CL 101 12/29/2016 1015   CO2 26 12/29/2016 1015   GLUCOSE 126 (H) 12/29/2016 1015   BUN 12 12/29/2016 1015   CREATININE 1.03 12/29/2016 1015   CALCIUM 10.0 12/29/2016 1015   GFRNONAA >60 12/29/2016 1015   GFRAA >60 12/29/2016 1015     Diabetic Labs (most recent): Lab Results  Component Value Date   HGBA1C 9.5 10/03/2018    Assessment & Plan:   1. Uncontrolled type 2 diabetes mellitus with hyperglycemia (HCC) - Scott Griffin has currently uncontrolled symptomatic type 2 DM since  58 years of age. -Reports controlled glycemic profile to near target both fasting and postprandial.  Fasting between 141-150,  prelunch between 128-135, supper between 117-156, bedtime between 135-147.    - his  most recent A1c of 9.5 %. Recent labs reviewed. - I had a long discussion with him about the progressive nature of  diabetes and the pathology behind its complications. -his diabetes is complicated by chronic heavy smoking, sedentary life and he remains at a high risk for more acute and chronic complications which include CAD, CVA, CKD, retinopathy, and neuropathy. These are all discussed in detail with him.  - I have counseled him on diet  and weight management  by adopting a carbohydrate restricted/protein rich diet. Patient is encouraged to switch to  unprocessed or minimally processed     complex starch and increased protein intake (animal or plant source), fruits, and vegetables. -  he is advised to stick to a routine mealtimes to eat 3 meals  a day and avoid unnecessary snacks ( to snack only to correct hypoglycemia).   - he  admits there is a room for improvement in his diet and drink choices. -  Suggestion is made for him to avoid simple carbohydrates  from his diet including Cakes, Sweet Desserts / Pastries, Ice Cream, Soda (diet and regular), Sweet Tea, Candies, Chips, Cookies, Sweet Pastries,  Store Bought Juices, Alcohol in Excess of  1-2 drinks a day, Artificial Sweeteners, Coffee Creamer, and "Sugar-free" Products. This will help patient to have stable blood glucose profile and potentially avoid unintended weight gain.   - he will be scheduled with Norm SaltPenny Crumpton, RDN, CDE for diabetes education.  - I have approached him with the following individualized plan to manage  his diabetes and patient agrees:   -Based on his glycemic response, he will not need insulin treatment for now.  - he is encouraged to call clinic for blood glucose levels less than 70 or above 300 mg /dl. - he is advised to continue Victoza 1.8 mg subcutaneously daily, therapeutically suitable for patient . - his Invokamet will be discontinued, risk outweighs benefit for this patient. -He will be continued on metformin 1000 mg p.o. twice daily. -He is advised to continue glipizide 5 mg XL p.o. daily at breakfast.   - Specific  targets for  A1c;  LDL, HDL, Triglycerides, and  Waist Circumference were discussed with the patient.  2) Blood Pressure /Hypertension:  he is advised to home monitor blood pressure and report if > 140/90 on 2 separate readings.  he is advised to continue his current medications including benazepril 40 mg p.o. daily with breakfast . 3) Lipids/Hyperlipidemia:   Review of his recent lipid panel showed controlled  LDL at 771 .  However, he would benefit from statin treatment, discussed and initiated atorvastatin 20 mg p.o. nightly.   Side effects and precautions discussed with him.  4)  Weight/Diet:  There is no height or weight on file to calculate BMI.  -   he is not a candidate for  Major weight loss. I discussed with him the fact that loss of 5 - 10% of his  current body weight will have the most impact on his diabetes management.  Exercise, and detailed carbohydrates information provided  -  detailed on discharge instructions.  5) Chronic Care/Health Maintenance:  -he  is on ACEI/ARB and Statin medications and  is encouraged to initiate and continue to follow up with Ophthalmology, Dentist,  Podiatrist at least yearly or according to recommendations, and advised to  Quit smoking. I have recommended yearly flu vaccine and pneumonia vaccine at least every 5 years; moderate intensity exercise for up to 150 minutes weekly; and  sleep for at least 7 hours a day.  -She is extensively counseled for smoking cessation.  - he is  advised to maintain close follow up with Elfredia Nevins, MD for primary care needs, as well as his other providers for optimal and coordinated care.  - Patient Care Time Today:  25 min, of which >50% was spent in  counseling and the rest reviewing his  current and  previous labs/studies, previous treatments, his blood glucose readings, and medications' doses and developing a plan for long-term care based on the latest recommendations for standards of care.   Scott Griffin  participated in the discussions, expressed understanding, and voiced agreement with the above plans.  All questions were answered to his satisfaction. he is encouraged to contact clinic should he have any questions or concerns prior to his return visit.   Follow up plan: - Return in about 9 weeks (around 03/06/2019) for Follow up with Pre-visit Labs, Next Visit A1c in Office, Include 8 log sheets.  Marquis Lunch, MD [facility] [address] [place], [place] [address] Phone: [phone]  Fax: [phone]    01/02/2019, 9:06 AM  This note was partially dictated with voice recognition software. Similar sounding words can be transcribed inadequately or may not  be corrected upon review.

## 2019-01-02 NOTE — Patient Instructions (Signed)

## 2019-02-06 ENCOUNTER — Ambulatory Visit: Payer: Medicare PPO | Admitting: Nutrition

## 2019-02-19 ENCOUNTER — Other Ambulatory Visit: Payer: Self-pay | Admitting: "Endocrinology

## 2019-03-07 ENCOUNTER — Ambulatory Visit: Payer: Medicare PPO | Admitting: Nutrition

## 2019-03-07 ENCOUNTER — Ambulatory Visit: Payer: Medicare PPO | Admitting: "Endocrinology

## 2019-04-14 ENCOUNTER — Other Ambulatory Visit: Payer: Self-pay | Admitting: "Endocrinology

## 2019-06-06 ENCOUNTER — Other Ambulatory Visit: Payer: Self-pay | Admitting: "Endocrinology

## 2019-06-30 DIAGNOSIS — F329 Major depressive disorder, single episode, unspecified: Secondary | ICD-10-CM | POA: Diagnosis not present

## 2019-06-30 DIAGNOSIS — E663 Overweight: Secondary | ICD-10-CM | POA: Diagnosis not present

## 2019-06-30 DIAGNOSIS — Z23 Encounter for immunization: Secondary | ICD-10-CM | POA: Diagnosis not present

## 2019-06-30 DIAGNOSIS — Z1389 Encounter for screening for other disorder: Secondary | ICD-10-CM | POA: Diagnosis not present

## 2019-06-30 DIAGNOSIS — Z6828 Body mass index (BMI) 28.0-28.9, adult: Secondary | ICD-10-CM | POA: Diagnosis not present

## 2019-06-30 DIAGNOSIS — Z719 Counseling, unspecified: Secondary | ICD-10-CM | POA: Diagnosis not present

## 2019-06-30 DIAGNOSIS — E119 Type 2 diabetes mellitus without complications: Secondary | ICD-10-CM | POA: Diagnosis not present

## 2019-06-30 DIAGNOSIS — E7849 Other hyperlipidemia: Secondary | ICD-10-CM | POA: Diagnosis not present

## 2019-06-30 DIAGNOSIS — I10 Essential (primary) hypertension: Secondary | ICD-10-CM | POA: Diagnosis not present

## 2019-06-30 DIAGNOSIS — E1165 Type 2 diabetes mellitus with hyperglycemia: Secondary | ICD-10-CM | POA: Diagnosis not present

## 2019-11-01 DIAGNOSIS — M5416 Radiculopathy, lumbar region: Secondary | ICD-10-CM | POA: Diagnosis not present

## 2019-11-08 DIAGNOSIS — Z20828 Contact with and (suspected) exposure to other viral communicable diseases: Secondary | ICD-10-CM | POA: Diagnosis not present

## 2020-03-12 ENCOUNTER — Other Ambulatory Visit: Payer: Self-pay | Admitting: "Endocrinology

## 2020-04-01 ENCOUNTER — Telehealth: Payer: Self-pay

## 2020-04-01 NOTE — Telephone Encounter (Signed)
Received updated referral and labs on patient-patient is already established here and missed last few appointments. He needs to do his thyroid labs, and vitamin D. We do have his A1C but we need the others to see him in the office. I spoke with his wife and she said she would have him call us to sch.

## 2021-02-04 ENCOUNTER — Emergency Department (HOSPITAL_COMMUNITY)
Admission: EM | Admit: 2021-02-04 | Discharge: 2021-02-04 | Disposition: A | Discharge status: Home / Self Care | Payer: Medicare (Managed Care) | Attending: Emergency Medicine | Admitting: Emergency Medicine

## 2021-02-04 ENCOUNTER — Encounter (HOSPITAL_COMMUNITY): Payer: Self-pay | Admitting: Emergency Medicine

## 2021-02-04 ENCOUNTER — Other Ambulatory Visit: Payer: Self-pay

## 2021-02-04 ENCOUNTER — Emergency Department (HOSPITAL_COMMUNITY): Payer: Medicare (Managed Care)

## 2021-02-04 DIAGNOSIS — X509XXA Other and unspecified overexertion or strenuous movements or postures, initial encounter: Secondary | ICD-10-CM | POA: Insufficient documentation

## 2021-02-04 DIAGNOSIS — Z79899 Other long term (current) drug therapy: Secondary | ICD-10-CM | POA: Diagnosis not present

## 2021-02-04 DIAGNOSIS — F17210 Nicotine dependence, cigarettes, uncomplicated: Secondary | ICD-10-CM | POA: Diagnosis not present

## 2021-02-04 DIAGNOSIS — S46912A Strain of unspecified muscle, fascia and tendon at shoulder and upper arm level, left arm, initial encounter: Secondary | ICD-10-CM | POA: Diagnosis not present

## 2021-02-04 DIAGNOSIS — E1165 Type 2 diabetes mellitus with hyperglycemia: Secondary | ICD-10-CM | POA: Diagnosis not present

## 2021-02-04 DIAGNOSIS — Z7982 Long term (current) use of aspirin: Secondary | ICD-10-CM | POA: Insufficient documentation

## 2021-02-04 DIAGNOSIS — S4992XA Unspecified injury of left shoulder and upper arm, initial encounter: Secondary | ICD-10-CM | POA: Diagnosis present

## 2021-02-04 DIAGNOSIS — I10 Essential (primary) hypertension: Secondary | ICD-10-CM | POA: Diagnosis not present

## 2021-02-04 DIAGNOSIS — Z7984 Long term (current) use of oral hypoglycemic drugs: Secondary | ICD-10-CM | POA: Insufficient documentation

## 2021-02-04 MED ORDER — MELOXICAM 15 MG PO TABS: 15.0000 mg | ORAL_TABLET | Freq: Every day | ORAL | 0 refills | Status: DC

## 2021-02-04 NOTE — ED Provider Notes (Signed)
[facility] EMERGENCY DEPARTMENT Provider Note   CSN: [id number] Arrival date & time: 02/04/21  9563     History Chief Complaint  Patient presents with   Shoulder Pain    Scott Griffin is a 60 y.o. male.  Patient presents to the emergency department for evaluation of left shoulder pain.  Patient complains of continuous pain of the shoulder that worsens with any movement.  Patient reports that the pain began when he was throwing trash bags into a dumpster.  Pain has been persistent ever since.      Past Medical History:  Diagnosis Date   Diabetes mellitus without complication (HCC)    High cholesterol    Hypertension     Patient Active Problem List   Diagnosis Date Noted   Uncontrolled type 2 diabetes mellitus with hyperglycemia (HCC) 12/20/2018   Essential hypertension, benign 12/20/2018   Mixed hyperlipidemia 12/20/2018    Past Surgical History:  Procedure Laterality Date   BACK SURGERY  [id number]   lumbar disk   OPEN REDUCTION INTERNAL FIXATION (ORIF) SCAPHOID WITH ILIAC CREST BONE GRAFT Left 05/16/2014   Procedure: OPEN REDUCTION INTERNAL FIXATION (ORIF) LEFT SCAPHOID WITH ILIAC CREST BONE GRAFT;  Surgeon: Tarry Kos, MD;  Location: [facility];  Service: Orthopedics;  Laterality: Left;       Family History  Problem Relation Age of Onset   Hypertension Mother    Diabetes Mother    Hypertension Father    Diabetes Father    Diabetes Sister     Social History   Tobacco Use   Smoking status: Every Day    Packs/day: 1.00    Types: Cigarettes   Smokeless tobacco: Never  Substance Use Topics   Alcohol use: No   Drug use: No    Home Medications Prior to Admission medications   Medication Sig Start Date End Date Taking? Authorizing Provider  Accu-Chek FastClix Lancets MISC USE TO CHECK BLOOD SUGAR FOUR TIMES DAILY AS DIRECTED 02/21/19   Nida, Denman George, MD  amLODipine (NORVASC) 5 MG tablet Take 5 mg by mouth daily.    [provider]  aspirin 325 MG tablet Take 325 mg by mouth daily.    [provider]  atorvastatin (LIPITOR) 20 MG tablet Take 1 tablet (20 mg total) by mouth daily. 12/20/18   Roma Kayser, MD  benazepril (LOTENSIN) 40 MG tablet Take 40 mg by mouth daily.    [provider]  glipiZIDE (GLUCOTROL XL) 10 MG 24 hr tablet Take 5 mg by mouth daily with breakfast. 12/02/16   [provider]  glucose blood (ACCU-CHEK GUIDE) test strip USE TO TEST BLOOD SUGAR FOUR TIMES DAILY AS DIRECTED 04/17/19   Nida, Denman George, MD  liraglutide (VICTOZA) 18 MG/3ML SOPN Inject 1.8 mg into the skin daily.  11/16/16   [provider]  metFORMIN (GLUCOPHAGE) 1000 MG tablet Take 0.5 tablets (500 mg total) by mouth 2 (two) times daily with a meal. 12/20/18   Nida, Denman George, MD  oxyCODONE-acetaminophen (PERCOCET/ROXICET) 5-325 MG tablet Take 1 tablet every 4 (four) hours as needed by mouth. 12/29/16   Triplett, Tammy, PA-C    Allergies    Patient has no known allergies.  Review of Systems   Review of Systems  Musculoskeletal:  Positive for arthralgias.  Neurological: Negative.    Physical Exam Updated Vital Signs BP (!) 151/91    Pulse 85    Temp 98.1 F (36.7 C) (Oral)  Resp 17    Ht 5\' 9"  (1.753 m)    Wt 88 kg    SpO2 96%    BMI 28.65 kg/m   Physical Exam Constitutional:      Appearance: Normal appearance.  HENT:     Head: Atraumatic.  Cardiovascular:     Rate and Rhythm: Normal rate.     Pulses:          Radial pulses are 2+ on the left side.  Pulmonary:     Effort: Pulmonary effort is normal.  Musculoskeletal:     Left shoulder: Tenderness present. No swelling or deformity. Decreased range of motion.  Skin:    Findings: No bruising or erythema.  Neurological:     Mental Status: He is alert.     Cranial Nerves: Cranial nerves 2-12 are intact.     Sensory: Sensation is intact.     Motor: Motor function is intact. No weakness.    ED Results /  Procedures / Treatments   Labs (all labs ordered are listed, but only abnormal results are displayed) Labs Reviewed - No data to display  EKG None  Radiology No results found.  Procedures Procedures   Medications Ordered in ED Medications - No data to display  ED Course  I have reviewed the triage vital signs and the nursing notes.  Pertinent labs & imaging results that were available during my care of the patient were reviewed by me and considered in my medical decision making (see chart for details).    MDM Rules/Calculators/A&P                         Patient presents with acute left shoulder pain.  Patient reports the pain began when he was throwing a trash bag into a dumpster.  No deformity noted.  Normal neurovascular function.  Patient has significant painful inhibition.  X-ray without acute findings.  Sling for comfort, analgesia, follow-up with orthopedics.  Cannot rule out rotator cuff injury at this time.     Final Clinical Impression(s) / ED Diagnoses Final diagnoses:  Strain of left shoulder, initial encounter    Rx / DC Orders ED Discharge Orders     None        Demiyah Fischbach, , MD 02/04/21 [id number]

## 2021-02-04 NOTE — ED Triage Notes (Signed)
Pt c/o left shoulder pain since yesterday afternoon.

## 2021-02-04 NOTE — Discharge Instructions (Signed)
Your pain might be simply because you strained the shoulder, however I cannot rule out a rotator cuff tear.  You need to see an orthopedic doctor for follow-up.  Please call the listed doctor for an office visit.

## 2022-12-24 ENCOUNTER — Other Ambulatory Visit (HOSPITAL_COMMUNITY): Payer: Self-pay | Admitting: Family Medicine

## 2022-12-24 DIAGNOSIS — F17200 Nicotine dependence, unspecified, uncomplicated: Secondary | ICD-10-CM

## 2023-01-18 ENCOUNTER — Ambulatory Visit (HOSPITAL_COMMUNITY)
Admission: RE | Admit: 2023-01-18 | Discharge: 2023-01-18 | Disposition: A | Discharge status: Home / Self Care | Payer: Medicare HMO | Source: Ambulatory Visit | Attending: Family Medicine | Admitting: Family Medicine

## 2023-01-18 DIAGNOSIS — F17210 Nicotine dependence, cigarettes, uncomplicated: Secondary | ICD-10-CM | POA: Diagnosis not present

## 2023-01-18 DIAGNOSIS — Z122 Encounter for screening for malignant neoplasm of respiratory organs: Secondary | ICD-10-CM | POA: Diagnosis present

## 2023-01-18 DIAGNOSIS — F17200 Nicotine dependence, unspecified, uncomplicated: Secondary | ICD-10-CM | POA: Insufficient documentation

## 2023-05-11 ENCOUNTER — Ambulatory Visit: Payer: Self-pay | Admitting: Family Medicine

## 2023-05-13 ENCOUNTER — Ambulatory Visit (INDEPENDENT_AMBULATORY_CARE_PROVIDER_SITE_OTHER): Payer: Medicare HMO | Admitting: Family Medicine

## 2023-05-13 ENCOUNTER — Encounter: Payer: Self-pay | Admitting: Family Medicine

## 2023-05-13 VITALS — BP 144/85 | HR 78 | Ht 69.0 in | Wt 186.0 lb

## 2023-05-13 DIAGNOSIS — M5441 Lumbago with sciatica, right side: Secondary | ICD-10-CM

## 2023-05-13 DIAGNOSIS — M5442 Lumbago with sciatica, left side: Secondary | ICD-10-CM

## 2023-05-13 DIAGNOSIS — Z1159 Encounter for screening for other viral diseases: Secondary | ICD-10-CM | POA: Diagnosis not present

## 2023-05-13 DIAGNOSIS — E038 Other specified hypothyroidism: Secondary | ICD-10-CM

## 2023-05-13 DIAGNOSIS — E1165 Type 2 diabetes mellitus with hyperglycemia: Secondary | ICD-10-CM

## 2023-05-13 DIAGNOSIS — R7301 Impaired fasting glucose: Secondary | ICD-10-CM | POA: Diagnosis not present

## 2023-05-13 DIAGNOSIS — G8929 Other chronic pain: Secondary | ICD-10-CM

## 2023-05-13 DIAGNOSIS — Z114 Encounter for screening for human immunodeficiency virus [HIV]: Secondary | ICD-10-CM

## 2023-05-13 DIAGNOSIS — E7849 Other hyperlipidemia: Secondary | ICD-10-CM

## 2023-05-13 DIAGNOSIS — E559 Vitamin D deficiency, unspecified: Secondary | ICD-10-CM | POA: Diagnosis not present

## 2023-05-13 DIAGNOSIS — E782 Mixed hyperlipidemia: Secondary | ICD-10-CM

## 2023-05-13 DIAGNOSIS — I10 Essential (primary) hypertension: Secondary | ICD-10-CM | POA: Diagnosis not present

## 2023-05-13 DIAGNOSIS — Z7984 Long term (current) use of oral hypoglycemic drugs: Secondary | ICD-10-CM | POA: Diagnosis not present

## 2023-05-13 MED ORDER — MELOXICAM 15 MG PO TABS: 15.0000 mg | ORAL_TABLET | Freq: Every day | ORAL | 0 refills | Status: AC

## 2023-05-13 MED ORDER — OLMESARTAN MEDOXOMIL 20 MG PO TABS: 20.0000 mg | ORAL_TABLET | Freq: Every day | ORAL | 1 refills | Status: DC

## 2023-05-13 NOTE — Assessment & Plan Note (Addendum)
 The patient was encouraged to continue taking atorvastatin 20 mg daily for cholesterol management. Lifestyle modifications were also discussed, including avoiding simple carbohydrates such as cakes, sweet desserts, ice cream, soda (diet or regular), sweet tea, candies, chips, cookies, store-bought juices, excessive alcohol (more than 1-2 drinks per day), lemonade, artificial sweeteners, donuts, coffee creamers, and sugar-free products. Additionally, the patient was advised to reduce the consumption of greasy, fatty foods and increase physical activity to support cardiovascular health. The patient verbalized understanding and is aware of the plan of care.

## 2023-05-13 NOTE — Assessment & Plan Note (Signed)
 The patient presents with uncontrolled blood pressure in the clinic today. He reports taking Benazepril 40 mg daily. He has been experiencing mild headaches, which are often relieved with Tylenol. Benazepril 40 mg daily will be discontinued, and therapy will be initiated with Losartan 20 mg daily. A low-sodium diet and increased physical activity were encouraged. Symptoms of hypotension and symptomatic hypertension at the onset of therapy were discussed, and the patient verbalized understanding. The importance of treatment adherence, including maintaining a low-sodium diet and regular physical activity, was reinforced. The patient will follow up in four weeks to assess the effectiveness of the new medication and make adjustments as needed. BP Readings from Last 3 Encounters:  05/13/23 (!) 144/85  02/04/21 (!) 148/88  12/20/18 132/85

## 2023-05-13 NOTE — Progress Notes (Signed)
 New Patient Office Visit  Subjective:  Patient ID: Scott Griffin, male    DOB: 11-14-1960  Age: 63 y.o. MRN: [id number]  CC:  Chief Complaint  Patient presents with   Establish Care    Pt. Wants to talk about diabetes and whole body hurts    HPI TRAMAR BRUECKNER is a 62 y.o. male with past medical history of hypertension, hyperlipidemia, and type II diabetes presents for establishing care. For the details of today's visit, please refer to the assessment and plan.     Past Medical History:  Diagnosis Date   Diabetes mellitus without complication (HCC)    High cholesterol    Hypertension     Past Surgical History:  Procedure Laterality Date   BACK SURGERY  [phone]   lumbar disk   OPEN REDUCTION INTERNAL FIXATION (ORIF) SCAPHOID WITH ILIAC CREST BONE GRAFT Left 05/16/2014   Procedure: OPEN REDUCTION INTERNAL FIXATION (ORIF) LEFT SCAPHOID WITH ILIAC CREST BONE GRAFT;  Surgeon: Tarry Kos, MD;  [facility];  Service: Orthopedics;  Laterality: Left;    Family History  Problem Relation Age of Onset   Hypertension Mother    Diabetes Mother    Hypertension Father    Diabetes Father    Diabetes Sister     Social History   Socioeconomic History   Marital status: Married    Spouse name: Not on file   Number of children: Not on file   Years of education: Not on file   Highest education level: Not on file  Occupational History   Not on file  Tobacco Use   Smoking status: Every Day    Current packs/day: 1.00    Types: Cigarettes   Smokeless tobacco: Never  Substance and Sexual Activity   Alcohol use: No   Drug use: No   Sexual activity: Not on file  Other Topics Concern   Not on file  Social History Narrative   Not on file   Social Drivers of Health   Financial Resource Strain: Not on file  Food Insecurity: Not on file  Transportation Needs: Not on file  Physical Activity: Not on file  Stress: Not on file  Social Connections:  Not on file  Intimate Partner Violence: Not on file    ROS Review of Systems  Constitutional:  Negative for fatigue and fever.  Eyes:  Negative for visual disturbance.  Respiratory:  Negative for chest tightness and shortness of breath.   Cardiovascular:  Negative for chest pain and palpitations.  Neurological:  Negative for dizziness and headaches.    Objective:   Today's Vitals: BP (!) 144/85 (BP Location: Left Arm, Patient Position: Sitting, Cuff Size: Normal)   Pulse 78   Ht 5\' 9"  (1.753 m)   Wt 186 lb (84.4 kg)   SpO2 96%   BMI 27.47 kg/m   Physical Exam HENT:     Head: Normocephalic.     Right Ear: External ear normal.     Left Ear: External ear normal.     Nose: No congestion or rhinorrhea.     Mouth/Throat:     Mouth: Mucous membranes are moist.  Cardiovascular:     Rate and Rhythm: Regular rhythm.     Heart sounds: No murmur heard. Pulmonary:     Effort: No respiratory distress.     Breath sounds: Normal breath sounds.  Neurological:     Mental Status: He is alert.      Assessment & Plan:  Essential hypertension, benign Assessment & Plan: The patient presents with uncontrolled blood pressure in the clinic today. He reports taking Benazepril 40 mg daily. He has been experiencing mild headaches, which are often relieved with Tylenol. Benazepril 40 mg daily will be discontinued, and therapy will be initiated with Losartan 20 mg daily. A low-sodium diet and increased physical activity were encouraged. Symptoms of hypotension and symptomatic hypertension at the onset of therapy were discussed, and the patient verbalized understanding. The importance of treatment adherence, including maintaining a low-sodium diet and regular physical activity, was reinforced. The patient will follow up in four weeks to assess the effectiveness of the new medication and make adjustments as needed. BP Readings from Last 3 Encounters:  05/13/23 (!) 144/85  02/04/21 (!) 148/88   12/20/18 132/85     Orders: -     Olmesartan Medoxomil; Take 1 tablet (20 mg total) by mouth daily.  Dispense: 30 tablet; Refill: 1 -     Microalbumin / creatinine urine ratio  Uncontrolled type 2 diabetes mellitus with hyperglycemia (HCC) Assessment & Plan: The patient is currently taking Metformin 1000 mg twice daily and Glipizide 10 mg daily. He reports previously taking Trulicity; however, the therapy was discontinued due to a high co-pay. Reinstating Trulicity will be considered if his hemoglobin A1c is elevated. A referral to pharmacy services may also be considered for assistance with medication affordability if goals are not being met.The patient is advised to reduce intake of high-sugar foods and beverages and to increase physical activity    Mixed hyperlipidemia Assessment & Plan: The patient was encouraged to continue taking atorvastatin 20 mg daily for cholesterol management. Lifestyle modifications were also discussed, including avoiding simple carbohydrates such as cakes, sweet desserts, ice cream, soda (diet or regular), sweet tea, candies, chips, cookies, store-bought juices, excessive alcohol (more than 1-2 drinks per day), lemonade, artificial sweeteners, donuts, coffee creamers, and sugar-free products. Additionally, the patient was advised to reduce the consumption of greasy, fatty foods and increase physical activity to support cardiovascular health. The patient verbalized understanding and is aware of the plan of care.    Chronic bilateral low back pain with bilateral sciatica -     Meloxicam; Take 1 tablet (15 mg total) by mouth daily.  Dispense: 30 tablet; Refill: 0  IFG (impaired fasting glucose) -     Hemoglobin A1c  Vitamin D deficiency -     VITAMIN D 25 Hydroxy (Vit-D Deficiency, Fractures)  Need for hepatitis C screening test -     Hepatitis C antibody  Encounter for screening for HIV -     HIV Antibody (routine testing w rflx)  TSH  (thyroid-stimulating hormone deficiency) -     TSH + free T4  Other hyperlipidemia -     Lipid panel -     CMP14+EGFR -     CBC with Differential/Platelet   Note: This chart has been completed using Engineer, civil (consulting) software, and while attempts have been made to ensure accuracy, certain words and phrases may not be transcribed as intended.    Follow-up: Return in about 1 month (around 06/13/2023).   Gilmore Laroche, FNP

## 2023-05-13 NOTE — Patient Instructions (Addendum)
 I appreciate the opportunity to provide care to you today!    Follow up:  1 months  Labs: please stop by the lab today to get your blood drawn (CBC, CMP, TSH, Lipid profile, HgA1c, Vit D)  Screening: HIV and Hep C  Schedule medicare annual wellness visit  Hypertension Management  Your current blood pressure is above the target goal of <140/90 mmHg. To address this, please continue taking olmesartan 20 mg daily   Medication Instructions: Take your blood pressure medication at the same time each day. After taking your medication, check your blood pressure at least an hour later. If your first reading is >140/90 mmHg, wait at least 10 minutes and recheck your blood pressure. Side Effects: In the initial days of therapy, you may experience dizziness or lightheadedness as your body adjusts to the lower blood pressure; this is expected. Diet and Lifestyle: Adhere to a low-sodium diet, limiting intake to less than 1500 mg daily, and increase your physical activity. Avoid over-the-counter NSAIDs such as ibuprofen and naproxen while on this medication. Hydration and Nutrition: Stay well-hydrated by drinking at least 64 ounces of water daily. Increase your servings of fruits and vegetables and avoid excessive sodium in your diet. Long-Term Considerations: Uncontrolled hypertension can increase the risk of cardiovascular diseases, including stroke, coronary artery disease, and heart failure.  Please report to the emergency department if your blood pressure exceeds 180/120 and is accompanied by symptoms such as headaches, chest pain, palpitations, blurred vision, or dizziness.    Attached with your AVS, you will find valuable resources for self-education. I highly recommend dedicating some time to thoroughly examine them.   Please continue to a heart-healthy diet and increase your physical activities. Try to exercise for at least five days a week.    It was a pleasure to see you and I look  forward to continuing to work together on your health and well-being. Please do not hesitate to call the office if you need care or have questions about your care.  In case of emergency, please visit the Emergency Department for urgent care, or contact our clinic at [phone] to schedule an appointment. We're here to help you!   Have a wonderful day and week. With Gratitude, Gilmore Laroche MSN, FNP-BC

## 2023-05-13 NOTE — Assessment & Plan Note (Signed)
 The patient is currently taking Metformin 1000 mg twice daily and Glipizide 10 mg daily. He reports previously taking Trulicity; however, the therapy was discontinued due to a high co-pay. Reinstating Trulicity will be considered if his hemoglobin A1c is elevated. A referral to pharmacy services may also be considered for assistance with medication affordability if goals are not being met.The patient is advised to reduce intake of high-sugar foods and beverages and to increase physical activity

## 2023-05-14 LAB — HEMOGLOBIN A1C
Est. average glucose Bld gHb Est-mCnc: 324 mg/dL
Hgb A1c MFr Bld: 12.9 % — ABNORMAL HIGH (ref 4.8–5.6)

## 2023-05-14 LAB — CMP14+EGFR
ALT: 10 IU/L (ref 0–44)
AST: 13 IU/L (ref 0–40)
Albumin: 4.7 g/dL (ref 3.9–4.9)
Alkaline Phosphatase: 95 IU/L (ref 44–121)
BUN/Creatinine Ratio: 17 (ref 10–24)
BUN: 19 mg/dL (ref 8–27)
Bilirubin Total: 0.2 mg/dL (ref 0.0–1.2)
CO2: 20 mmol/L (ref 20–29)
Calcium: 9.9 mg/dL (ref 8.6–10.2)
Chloride: 100 mmol/L (ref 96–106)
Creatinine, Ser: 1.09 mg/dL (ref 0.76–1.27)
Globulin, Total: 2.5 g/dL (ref 1.5–4.5)
Glucose: 217 mg/dL — ABNORMAL HIGH (ref 70–99)
Potassium: 5.4 mmol/L — ABNORMAL HIGH (ref 3.5–5.2)
Sodium: 136 mmol/L (ref 134–144)
Total Protein: 7.2 g/dL (ref 6.0–8.5)
eGFR: 75 mL/min/{1.73_m2} (ref >59)

## 2023-05-14 LAB — CBC WITH DIFFERENTIAL/PLATELET
Basophils Absolute: 0 10*3/uL (ref 0.0–0.2)
Basos: 0 %
EOS (ABSOLUTE): 0.1 10*3/uL (ref 0.0–0.4)
Eos: 1 %
Hematocrit: 48.4 % (ref 37.5–51.0)
Hemoglobin: 15.6 g/dL (ref 13.0–17.7)
Immature Grans (Abs): 0 10*3/uL (ref 0.0–0.1)
Immature Granulocytes: 0 %
Lymphocytes Absolute: 2.3 10*3/uL (ref 0.7–3.1)
Lymphs: 25 %
MCH: 27.5 pg (ref 26.6–33.0)
MCHC: 32.2 g/dL (ref 31.5–35.7)
MCV: 85 fL (ref 79–97)
Monocytes Absolute: 0.7 10*3/uL (ref 0.1–0.9)
Monocytes: 8 %
Neutrophils Absolute: 5.9 10*3/uL (ref 1.4–7.0)
Neutrophils: 66 %
Platelets: 219 10*3/uL (ref 150–450)
RBC: 5.68 x10E6/uL (ref 4.14–5.80)
RDW: 13.1 % (ref 11.6–15.4)
WBC: 9.1 10*3/uL (ref 3.4–10.8)

## 2023-05-14 LAB — LIPID PANEL
Chol/HDL Ratio: 3.4 ratio (ref 0.0–5.0)
Cholesterol, Total: 130 mg/dL (ref 100–199)
HDL: 38 mg/dL — ABNORMAL LOW (ref >39)
LDL Chol Calc (NIH): 63 mg/dL (ref 0–99)
Triglycerides: 170 mg/dL — ABNORMAL HIGH (ref 0–149)
VLDL Cholesterol Cal: 29 mg/dL (ref 5–40)

## 2023-05-14 LAB — TSH+FREE T4
Free T4: 1.29 ng/dL (ref 0.82–1.77)
TSH: 1.32 u[IU]/mL (ref 0.450–4.500)

## 2023-05-14 LAB — HIV ANTIBODY (ROUTINE TESTING W REFLEX): HIV Screen 4th Generation wRfx: NONREACTIVE

## 2023-05-14 LAB — HEPATITIS C ANTIBODY: Hep C Virus Ab: NONREACTIVE

## 2023-05-14 LAB — VITAMIN D 25 HYDROXY (VIT D DEFICIENCY, FRACTURES): Vit D, 25-Hydroxy: 15.5 ng/mL — ABNORMAL LOW (ref 30.0–100.0)

## 2023-05-17 ENCOUNTER — Other Ambulatory Visit: Payer: Self-pay | Admitting: Family Medicine

## 2023-05-17 DIAGNOSIS — E1165 Type 2 diabetes mellitus with hyperglycemia: Secondary | ICD-10-CM

## 2023-05-17 DIAGNOSIS — E559 Vitamin D deficiency, unspecified: Secondary | ICD-10-CM

## 2023-05-17 MED ORDER — TRULICITY 0.75 MG/0.5ML ~~LOC~~ SOAJ: 0.7500 mg | SUBCUTANEOUS | 0 refills | Status: DC

## 2023-05-17 MED ORDER — VITAMIN D (ERGOCALCIFEROL) 1.25 MG (50000 UNIT) PO CAPS: 50000.0000 [IU] | ORAL_CAPSULE | ORAL | 1 refills | Status: AC

## 2023-05-17 NOTE — Progress Notes (Signed)
 Please inform the patient that his hemoglobin A1c is elevated at 12.9. A prescription for Ozempic 0.75 mg weekly has been sent to his pharmacy to initiate therapy. I recommend that he request monthly refills to allow for dose escalation as appropriate. Please advise the patient to notify us if he is unable to pick up the prescription due to cost.  He should continue taking Glipizide 10 mg daily and Metformin 1000 mg twice daily. His vitamin D level is low, and a weekly vitamin D supplement has been prescribed and sent to his pharmacy.  His potassium level is slightly elevated; I recommend decreasing his intake of potassium-rich foods such as bananas, oranges, tomatoes, potatoes, and leafy greens.

## 2023-05-18 ENCOUNTER — Telehealth: Payer: Self-pay | Admitting: Family Medicine

## 2023-05-18 NOTE — Telephone Encounter (Signed)
 Patient came by the office his A1C is high asking will Malachi Bonds up his dose on his medication. His appointment is not until June call patient at [phone]  Pharmacy: [facility]

## 2023-05-19 ENCOUNTER — Other Ambulatory Visit (HOSPITAL_COMMUNITY): Payer: Self-pay

## 2023-05-19 ENCOUNTER — Telehealth: Payer: Self-pay | Admitting: Pharmacy Technician

## 2023-05-19 NOTE — Telephone Encounter (Signed)
 Pharmacy Patient Advocate Encounter   Received notification from CoverMyMeds that prior authorization for Trulicity 0.75MG /0.5ML auto-injectors is required/requested.   Insurance verification completed.   The patient is insured through [facility] .   Per test claim: PA required; PA submitted to above mentioned insurance via CoverMyMeds Key/confirmation #/EOC [id number] Status is pending

## 2023-05-19 NOTE — Telephone Encounter (Signed)
 Pharmacy Patient Advocate Encounter  Received notification from [facility] that Prior Authorization for Trulicity 0.75MG /0.5ML auto-injectors has been APPROVED from 05/19/2023 to 02/16/2024. Ran test claim, Copay is $268.72. This test claim was processed through [facility]- copay amounts may vary at other pharmacies due to pharmacy/plan contracts, or as the patient moves through the different stages of their insurance plan.   PA #/Case ID/Reference #: [id number]  Copay applied to deductible. Copay will be $47.00 after deductible is met. See below regarding tier exception request denial.

## 2023-06-23 ENCOUNTER — Other Ambulatory Visit: Payer: Self-pay | Admitting: Family Medicine

## 2023-07-06 ENCOUNTER — Other Ambulatory Visit: Payer: Self-pay | Admitting: Family Medicine

## 2023-07-06 DIAGNOSIS — I10 Essential (primary) hypertension: Secondary | ICD-10-CM

## 2023-08-06 ENCOUNTER — Encounter: Payer: Self-pay | Admitting: Family Medicine

## 2023-08-06 ENCOUNTER — Ambulatory Visit (INDEPENDENT_AMBULATORY_CARE_PROVIDER_SITE_OTHER): Admitting: Family Medicine

## 2023-08-06 VITALS — BP 123/72 | HR 76 | Resp 16 | Ht 69.0 in | Wt 182.0 lb

## 2023-08-06 DIAGNOSIS — M5442 Lumbago with sciatica, left side: Secondary | ICD-10-CM | POA: Diagnosis not present

## 2023-08-06 DIAGNOSIS — Z7984 Long term (current) use of oral hypoglycemic drugs: Secondary | ICD-10-CM | POA: Diagnosis not present

## 2023-08-06 DIAGNOSIS — H6093 Unspecified otitis externa, bilateral: Secondary | ICD-10-CM

## 2023-08-06 DIAGNOSIS — M5441 Lumbago with sciatica, right side: Secondary | ICD-10-CM

## 2023-08-06 DIAGNOSIS — E1165 Type 2 diabetes mellitus with hyperglycemia: Secondary | ICD-10-CM

## 2023-08-06 MED ORDER — GLIPIZIDE ER 10 MG PO TB24: 10.0000 mg | ORAL_TABLET | Freq: Every day | ORAL | 1 refills | Status: DC

## 2023-08-06 MED ORDER — OZEMPIC (0.25 OR 0.5 MG/DOSE) 2 MG/3ML ~~LOC~~ SOPN: 0.2500 mg | PEN_INJECTOR | SUBCUTANEOUS | Status: DC

## 2023-08-06 MED ORDER — CYCLOBENZAPRINE HCL 5 MG PO TABS: 5.0000 mg | ORAL_TABLET | Freq: Three times a day (TID) | ORAL | 2 refills | Status: DC | PRN

## 2023-08-06 MED ORDER — CIPROFLOXACIN-DEXAMETHASONE 0.3-0.1 % OT SUSP: 4.0000 [drp] | Freq: Two times a day (BID) | OTIC | 0 refills | Status: AC

## 2023-08-06 MED ORDER — GABAPENTIN 100 MG PO CAPS: 100.0000 mg | ORAL_CAPSULE | Freq: Three times a day (TID) | ORAL | 3 refills | Status: DC

## 2023-08-06 MED ORDER — OZEMPIC (0.25 OR 0.5 MG/DOSE) 2 MG/3ML ~~LOC~~ SOPN: 0.2500 mg | PEN_INJECTOR | SUBCUTANEOUS | 0 refills | Status: DC

## 2023-08-06 MED ORDER — NAPROXEN 500 MG PO TABS: 500.0000 mg | ORAL_TABLET | Freq: Two times a day (BID) | ORAL | 2 refills | Status: DC

## 2023-08-06 MED ORDER — METFORMIN HCL 1000 MG PO TABS: 1000.0000 mg | ORAL_TABLET | Freq: Two times a day (BID) | ORAL | 3 refills | Status: DC

## 2023-08-06 NOTE — Patient Instructions (Addendum)
 I appreciate the opportunity to provide care to you today!    Follow up:  1 months  Type 2 Diabetes: -Start Ozempic 0.25 mg weekly, Glipizide 10 mg daily, and Metformin  1000 mg twice daily.  Recommend decreasing intake of high-sugar foods and beverages and increasing physical activity to support blood sugar control.  Advise the patient to report to the emergency department if blood sugar remains consistently above 250, especially if accompanied by nausea, vomiting, abdominal pain, fruity-smelling breath, blurred vision, weight loss, or confusion, as these may indicate diabetic ketoacidosis.  Otitis Externa: Start Ciprodex otic drops: 4 drops in each ear twice daily as directed for 7 days.  Sciatica: -May take Naproxen  500 mg twice daily as needed for pain. -Continue Flexeril 5 mg three times daily and Gabapentin 100 mg three times daily as needed for nerve-related discomfort.  Recommend: -Rest with activity modification (avoid prolonged sitting, standing, or heavy lifting) -Ice or heat application to the lower back for symptom relief -Gentle stretching and mobility exercises as tolerated   Please follow up if your symptoms worsen or fail to improve.   Referrals today- [facility] ( pharmacy for medication assistance)    Please continue to a heart-healthy diet and increase your physical activities. Try to exercise for at least five days a week.    It was a pleasure to see you and I look forward to continuing to work together on your health and well-being. Please do not hesitate to call the office if you need care or have questions about your care.  In case of emergency, please visit the Emergency Department for urgent care, or contact our clinic at [phone] to schedule an appointment. We're here to help you!   Have a wonderful day and week. With Gratitude, Jacklin Zwick MSN, FNP-BC

## 2023-08-06 NOTE — Progress Notes (Signed)
 Established Patient Office Visit  Subjective:  Patient ID: Scott Griffin, male    DOB: 11/23/1960  Age: 63 y.o. MRN: [id number]  CC:  Chief Complaint  Patient presents with   Ear Fullness    States both feel like something is crawling in his ears and itchy on the inside.    Sciatica    Has flare up of bilateral sciatica. Sharp pains run down both legs and bilateral leg weakness, states he cannot walk long periods    Diabetes    Sugar has been running high     HPI Scott Griffin is a 63 y.o. male with a past medical history of type 2 diabetes and presents with the above complaints. No fever, hearing loss, or drainage from the ears is noted. The patient reports a history of two back surgeries in the 90s for ruptured discs at L4 and L5. Today, he complains of low back pain with numbness and tingling radiating down his lower legs. The pain is rated 7/10 and worsens with prolonged standing, sitting, and walking. It is described as sharp. He reports that his symptoms have been ongoing for about three years but have worsened over time. No bowel or bladder incontinence is reported. The patient also reports that he has not been taking his Trulicity  for the last three months due to the cost of the medication. He mentions that his blood sugar has been elevated into the 200s. The patient is asymptomatic today in the clinic.   Past Medical History:  Diagnosis Date   Diabetes mellitus without complication (HCC)    High cholesterol    Hypertension     Past Surgical History:  Procedure Laterality Date   BACK SURGERY  [phone]   lumbar disk   OPEN REDUCTION INTERNAL FIXATION (ORIF) SCAPHOID WITH ILIAC CREST BONE GRAFT Left 05/16/2014   Procedure: OPEN REDUCTION INTERNAL FIXATION (ORIF) LEFT SCAPHOID WITH ILIAC CREST BONE GRAFT;  Surgeon: Kay CHRISTELLA Cummins, MD;  Location: [facility];  Service: Orthopedics;  Laterality: Left;    Family History  Problem Relation Age of Onset    Hypertension Mother    Diabetes Mother    Hypertension Father    Diabetes Father    Diabetes Sister     Social History   Socioeconomic History   Marital status: Married    Spouse name: Not on file   Number of children: Not on file   Years of education: Not on file   Highest education level: Not on file  Occupational History   Not on file  Tobacco Use   Smoking status: Every Day    Current packs/day: 1.00    Types: Cigarettes   Smokeless tobacco: Never  Substance and Sexual Activity   Alcohol use: No   Drug use: No   Sexual activity: Not on file  Other Topics Concern   Not on file  Social History Narrative   Not on file   Social Drivers of Health   Financial Resource Strain: Not on file  Food Insecurity: Not on file  Transportation Needs: Not on file  Physical Activity: Not on file  Stress: Not on file  Social Connections: Not on file  Intimate Partner Violence: Not on file    Outpatient Medications Prior to Visit  Medication Sig Dispense Refill   Accu-Chek FastClix Lancets MISC USE TO CHECK BLOOD SUGAR FOUR TIMES DAILY AS DIRECTED 150 each 3   amLODipine (NORVASC) 5 MG tablet Take 5 mg by mouth  daily.     aspirin 325 MG tablet Take 325 mg by mouth daily.     atorvastatin  (LIPITOR) 20 MG tablet TAKE (1) TABLET BY MOUTH ONCE DAILY. 90 tablet 1   Dulaglutide  (TRULICITY ) 0.75 MG/0.5ML SOAJ Inject 0.75 mg into the skin once a week. 2 mL 0   glucose blood (ACCU-CHEK GUIDE) test strip USE TO TEST BLOOD SUGAR FOUR TIMES DAILY AS DIRECTED 200 strip 1   liraglutide (VICTOZA) 18 MG/3ML SOPN Inject 1.8 mg into the skin daily.      meloxicam  (MOBIC ) 15 MG tablet Take 1 tablet (15 mg total) by mouth daily. 30 tablet 0   olmesartan  (BENICAR ) 20 MG tablet Take 1 tablet by mouth daily. 30 tablet 1   Vitamin D , Ergocalciferol , (DRISDOL ) 1.25 MG (50000 UNIT) CAPS capsule Take 1 capsule (50,000 Units total) by mouth every 7 (seven) days. 20 capsule 1   glipiZIDE  (GLUCOTROL  XL) 10 MG  24 hr tablet Take 5 mg by mouth daily with breakfast.     metFORMIN  (GLUCOPHAGE ) 1000 MG tablet Take 0.5 tablets (500 mg total) by mouth 2 (two) times daily with a meal. 60 tablet 3   oxyCODONE -acetaminophen  (PERCOCET/ROXICET) 5-325 MG tablet Take 1 tablet every 4 (four) hours as needed by mouth. (Patient not taking: Reported on 08/06/2023) 15 tablet 0   No facility-administered medications prior to visit.    No Known Allergies  ROS Review of Systems  Constitutional:  Negative for fatigue and fever.  HENT:         Ear fullness  Eyes:  Negative for visual disturbance.  Respiratory:  Negative for chest tightness and shortness of breath.   Cardiovascular:  Negative for chest pain and palpitations.  Musculoskeletal:  Positive for back pain.  Neurological:  Negative for dizziness and headaches.      Objective:    Physical Exam HENT:     Head: Normocephalic.     Right Ear: Ear canal and external ear normal. No tenderness. No middle ear effusion. Tympanic membrane is not injected.     Left Ear: Ear canal and external ear normal. No tenderness.  No middle ear effusion. Tympanic membrane is not injected.     Nose: No congestion or rhinorrhea.     Mouth/Throat:     Mouth: Mucous membranes are moist.   Cardiovascular:     Rate and Rhythm: Regular rhythm.     Heart sounds: No murmur heard. Pulmonary:     Effort: No respiratory distress.     Breath sounds: Normal breath sounds.   Neurological:     Mental Status: He is alert.     BP 123/72   Pulse 76   Resp 16   Ht 5' 9 (1.753 m)   Wt 182 lb (82.6 kg)   SpO2 96%   BMI 26.88 kg/m  Wt Readings from Last 3 Encounters:  08/06/23 182 lb (82.6 kg)  05/13/23 186 lb (84.4 kg)  02/04/21 194 lb (88 kg)    Lab Results  Component Value Date   TSH 1.320 05/13/2023   Lab Results  Component Value Date   WBC 9.1 05/13/2023   HGB 15.6 05/13/2023   HCT 48.4 05/13/2023   MCV 85 05/13/2023   PLT 219 05/13/2023   Lab Results   Component Value Date   NA 136 05/13/2023   K 5.4 (H) 05/13/2023   CO2 20 05/13/2023   GLUCOSE 217 (H) 05/13/2023   BUN 19 05/13/2023   CREATININE 1.09 05/13/2023   BILITOT  0.2 05/13/2023   ALKPHOS 95 05/13/2023   AST 13 05/13/2023   ALT 10 05/13/2023   PROT 7.2 05/13/2023   ALBUMIN 4.7 05/13/2023   CALCIUM  9.9 05/13/2023   ANIONGAP 10 12/29/2016   EGFR 75 05/13/2023   Lab Results  Component Value Date   CHOL 130 05/13/2023   Lab Results  Component Value Date   HDL 38 (L) 05/13/2023   Lab Results  Component Value Date   LDLCALC 63 05/13/2023   Lab Results  Component Value Date   TRIG 170 (H) 05/13/2023   Lab Results  Component Value Date   CHOLHDL 3.4 05/13/2023   Lab Results  Component Value Date   HGBA1C 12.9 (H) 05/13/2023      Assessment & Plan:  Uncontrolled type 2 diabetes mellitus with hyperglycemia (HCC) Assessment & Plan: Encouraged to start taking Ozempic  0.25 mg weekly, Glipizide  10 mg daily, and Metformin  1000 mg twice daily.  Recommend decreasing intake of high-sugar foods and beverages and increasing physical activity to support blood sugar control.  Advise the patient to report to the emergency department if blood sugar remains consistently above 250, especially if accompanied by nausea, vomiting, abdominal pain, fruity-smelling breath, blurred vision, weight loss, or confusion, as these may indicate diabetic ketoacidosis.   Orders: -     AMB Referral [facility] Care Management -     glipiZIDE  ER; Take 1 tablet (10 mg total) by mouth daily with breakfast.  Dispense: 60 tablet; Refill: 1 -     metFORMIN  HCl; Take 1 tablet (1,000 mg total) by mouth 2 (two) times daily with a meal.  Dispense: 60 tablet; Refill: 3 -     Ozempic  (0.25 or 0.5 MG/DOSE); Inject 0.25 mg into the skin once a week.  Otitis externa of both ears, unspecified chronicity, unspecified type Assessment & Plan: Will treat today with Ciprodex  otic drops: 4 drops in each ear twice  daily as directed for 7 days.  Orders: -     Ciprofloxacin -dexAMETHasone ; Place 4 drops into both ears 2 (two) times daily for 7 days.  Dispense: 2.8 mL; Refill: 0  Low back pain due to bilateral sciatica Assessment & Plan: Encouraged to take Naproxen  500 mg twice daily as needed for pain. -Continue Flexeril  5 mg three times daily and Gabapentin  100 mg three times daily as needed for nerve-related discomfort.   Orders: -     Cyclobenzaprine  HCl; Take 1 tablet (5 mg total) by mouth 3 (three) times daily as needed for muscle spasms.  Dispense: 30 tablet; Refill: 2 -     Gabapentin ; Take 1 capsule (100 mg total) by mouth 3 (three) times daily.  Dispense: 90 capsule; Refill: 3 -     Naproxen ; Take 1 tablet (500 mg total) by mouth 2 (two) times daily with a meal.  Dispense: 60 tablet; Refill: 2  Note: This chart has been completed using Engineer, civil (consulting) software, and while attempts have been made to ensure accuracy, certain words and phrases may not be transcribed as intended.    Follow-up: Return in about 1 month (around 09/05/2023).   Khalfani Weideman, FNP

## 2023-08-07 DIAGNOSIS — M5441 Lumbago with sciatica, right side: Secondary | ICD-10-CM | POA: Insufficient documentation

## 2023-08-07 DIAGNOSIS — H6090 Unspecified otitis externa, unspecified ear: Secondary | ICD-10-CM | POA: Insufficient documentation

## 2023-08-07 NOTE — Assessment & Plan Note (Signed)
 Encouraged to take Naproxen  500 mg twice daily as needed for pain. -Continue Flexeril  5 mg three times daily and Gabapentin  100 mg three times daily as needed for nerve-related discomfort.

## 2023-08-07 NOTE — Assessment & Plan Note (Signed)
 Encouraged to start taking Ozempic  0.25 mg weekly, Glipizide  10 mg daily, and Metformin  1000 mg twice daily.  Recommend decreasing intake of high-sugar foods and beverages and increasing physical activity to support blood sugar control.  Advise the patient to report to the emergency department if blood sugar remains consistently above 250, especially if accompanied by nausea, vomiting, abdominal pain, fruity-smelling breath, blurred vision, weight loss, or confusion, as these may indicate diabetic ketoacidosis.

## 2023-08-07 NOTE — Assessment & Plan Note (Signed)
 Will treat today with Ciprodex  otic drops: 4 drops in each ear twice daily as directed for 7 days.

## 2023-08-09 ENCOUNTER — Telehealth: Payer: Self-pay

## 2023-08-09 NOTE — Progress Notes (Signed)
 Care Guide Pharmacy Note  08/09/2023 Name: Scott Griffin MRN: [id number] DOB: November 14, 1960  Referred By: Zarwolo, Gloria, FNP Reason for referral: Complex Care Management (Outreach to schedule with Pharm d )   Scott Griffin is a 63 y.o. year old male who is a primary care patient of Zarwolo, Gloria, FNP.  Scott Griffin was referred to the pharmacist for assistance related to: DMII  An unsuccessful telephone outreach was attempted today to contact the patient who was referred to the pharmacy team for assistance with medication assistance. Additional attempts will be made to contact the patient.  Scott Griffin , RMA     [facility]  [facility], [facility] Guide  Direct Dial: [phone]  Website: [email or web address]

## 2023-08-16 ENCOUNTER — Telehealth: Payer: Self-pay | Admitting: Family Medicine

## 2023-08-16 NOTE — Telephone Encounter (Signed)
 Please advise Copied from CRM [id number]. Topic: General - Call Back - No Documentation >> Aug 16, 2023 12:36 PM Montie POUR wrote: Reason for CRM:  Mr. Fanelli is returning a call from this clinic. I see no documentation. Please call him back if needed.

## 2023-08-16 NOTE — Progress Notes (Signed)
 Care Guide Pharmacy Note  08/16/2023 Name: JOSEP LUVIANO MRN: [id number] DOB: [birth year]  Referred By: Zarwolo, Gloria, FNP Reason for referral: Complex Care Management (Outreach to schedule with Pharm d )   RYLEIGH BUENGER is a 63 y.o. year old male who is a primary care patient of Zarwolo, Gloria, FNP.  [name] was referred to the pharmacist for assistance related to: DMII  Successful contact was made with the patient to discuss pharmacy services including being ready for the pharmacist to call at least 5 minutes before the scheduled appointment time and to have medication bottles and any blood pressure readings ready for review. The patient agreed to meet with the pharmacist via telephone visit on (date/time).08/19/2023  Jeoffrey Buffalo , RMA     [place]  [facility], [facility] Guide  Direct Dial: [phone]  Website: [email or web address]

## 2023-08-19 ENCOUNTER — Other Ambulatory Visit: Payer: Self-pay

## 2023-08-19 ENCOUNTER — Telehealth: Payer: Self-pay

## 2023-08-19 NOTE — Progress Notes (Signed)
 08/19/2023 Name: Scott Griffin MRN: [id number] DOB: [birth year]  Chief Complaint  Patient presents with   Diabetes   Medication Management    Scott Griffin is a 63 y.o. year old male who presented for a telephone visit.   They were referred to the pharmacist by their PCP for assistance in managing diabetes and medication access.    Subjective:  Care Team: Primary Care Provider: Zarwolo, Gloria, FNP ; Next Scheduled Visit:  Future Appointments  Date Time Provider Department Center  09/02/2023  1:00 PM Pandora Cadet, [facility] None  09/08/2023 10:40 AM RPC-ANNUAL WELLNESS VISIT [facility] RPC  10/07/2023  9:40 AM Zarwolo, Gloria, FNP RPC-RPC RPC     Medication Access/Adherence  Current Pharmacy:  [facility] [phone] - [place], [place] - [address] [place] [address] Phone: [phone] Fax: [phone]  [facility] - [place], [place] - [address] [place] [address] Phone: [phone] Fax: [phone]   Patient reports affordability concerns with their medications: Yes  Patient reports access/transportation concerns to their pharmacy: No  Patient reports adherence concerns with their medications:  No  just rx copay expense   Diabetes:  Current medications: glipizide  XL 10mg  once daily, ozempic  0.25mg  once weekly (has completed 2 doses as of 08/19/2023 telephone visit), metformin  1000 mg once daily  Medications tried in the past: trulicity  (cost)  Current glucose readings: 114, 115, 120. Much improved from previous 200-300 range before starting on ozempic  and making lifestyle changes. Is now walking every day and has cut out soda, which he would drink often.   Patient denies hypoglycemic s/sx including dizziness, shakiness, sweating. Patient denies hyperglycemic symptoms including polyuria, polydipsia, polyphagia, nocturia, neuropathy, blurred  vision.  After learning more about deductible of $255 through insurance and $47/month copay, is okay with cost of most recently prescribed GLP1, Ozempic .   Current medication access support: none required at this time   Objective:  Lab Results  Component Value Date   HGBA1C 12.9 (H) 05/13/2023   Lab Results  Component Value Date   HGBA1C 12.9 (H) 05/13/2023   HGBA1C 9.5 10/03/2018    Lab Results  Component Value Date   CREATININE 1.09 05/13/2023   BUN 19 05/13/2023   NA 136 05/13/2023   K 5.4 (H) 05/13/2023   CL 100 05/13/2023   CO2 20 05/13/2023    Lab Results  Component Value Date   CHOL 130 05/13/2023   HDL 38 (L) 05/13/2023   LDLCALC 63 05/13/2023   TRIG 170 (H) 05/13/2023   CHOLHDL 3.4 05/13/2023    Medications Reviewed Today     Reviewed by Pandora Cadet, [facility] (Pharmacist) on 08/19/23 at 1050  Med List Status: <None>   Medication Order Taking? Sig Documenting Provider Last Dose Status Informant  Accu-Chek FastClix Lancets MISC [id number]  USE TO CHECK BLOOD SUGAR FOUR TIMES DAILY AS DIRECTED Nida, Gebreselassie W, MD  Active   amLODipine (NORVASC) 5 MG tablet [id number]  Take 5 mg by mouth daily. [provider]  Active Self  aspirin 325 MG tablet [id number]  Take 325 mg by mouth daily. [provider]  Active Self  atorvastatin  (LIPITOR) 20 MG tablet [id number]  TAKE (1) TABLET BY MOUTH ONCE DAILY. Zarwolo, Gloria, FNP  Active   cyclobenzaprine  (FLEXERIL ) 5 MG tablet [id number]  Take 1 tablet (5 mg total) by mouth 3 (three) times daily as needed for  muscle spasms. Zarwolo, Gloria, FNP  Active   Dulaglutide  (TRULICITY ) 0.75 MG/0.5ML EMMANUEL [id number]  Inject 0.75 mg into the skin once a week.  Patient not taking: Reported on 08/19/2023   Zarwolo, Gloria, FNP  Consider Medication Status and Discontinue   gabapentin  (NEURONTIN ) 100 MG capsule [id number]  Take 1 capsule (100 mg total) by mouth 3 (three) times daily. Zarwolo, Gloria, FNP  Active   glipiZIDE   (GLUCOTROL  XL) 10 MG 24 hr tablet [id number] Yes Take 1 tablet (10 mg total) by mouth daily with breakfast. Zarwolo, Gloria, FNP  Active   glucose blood (ACCU-CHEK GUIDE) test strip [id number]  USE TO TEST BLOOD SUGAR FOUR TIMES DAILY AS DIRECTED Nida, Gebreselassie W, MD  Active   liraglutide (VICTOZA) 18 MG/3ML SOPN [id number]  Inject 1.8 mg into the skin daily.   Patient not taking: Reported on 08/19/2023   [provider]  Consider Medication Status and Discontinue   meloxicam  (MOBIC ) 15 MG tablet [id number]  Take 1 tablet (15 mg total) by mouth daily. Zarwolo, Gloria, FNP  Active   metFORMIN  (GLUCOPHAGE ) 1000 MG tablet [id number] Yes Take 1 tablet (1,000 mg total) by mouth 2 (two) times daily with a meal. Zarwolo, Gloria, FNP  Active   naproxen  (NAPROSYN ) 500 MG tablet [id number]  Take 1 tablet (500 mg total) by mouth 2 (two) times daily with a meal. Zarwolo, Gloria, FNP  Active   olmesartan  (BENICAR ) 20 MG tablet [id number]  Take 1 tablet by mouth daily. Zarwolo, Gloria, FNP  Active   oxyCODONE -acetaminophen  (PERCOCET/ROXICET) 5-325 MG tablet [id number]  Take 1 tablet every 4 (four) hours as needed by mouth.  Patient not taking: Reported on 08/06/2023   Triplett, Tammy, PA-C  Active   Semaglutide ,0.25 or 0.5MG /DOS, (OZEMPIC , 0.25 OR 0.5 MG/DOSE,) 2 MG/3ML SOPN [id number] Yes Inject 0.25 mg into the skin once a week. Zarwolo, Gloria, FNP  Active   Vitamin D , Ergocalciferol , (DRISDOL ) 1.25 MG (50000 UNIT) CAPS capsule [id number]  Take 1 capsule (50,000 Units total) by mouth every 7 (seven) days. Zarwolo, Gloria, FNP  Active             Assessment/Plan:   Diabetes: - Currently uncontrolled - Reviewed long term cardiovascular and renal outcomes of uncontrolled blood sugar - Reviewed goal A1c, goal fasting, and goal 2 hour post prandial glucose - Reviewed dietary modifications including low carb diet, cutting out soda.  - Reviewed lifestyle modifications including:  - Recommend to exercise  daily for 30 minutes  - Recommend to check glucose at least daily  - Reviewed insurance benefit information - ok with staying on ozempic  after understanding plan deductible and copay.  - Now that BG readings are trending lower, discussed potential risk of hypoglycemia with patient and proper management/signs. Will let office knows if this becomes a problem. Agreed on two week f/u to again review BG and talk about possible ozempic  increase and glipizide  decrease.  - Reviewed and evaluated patient assistance, however patient feels copays are reasonable for ozempic  so will hold off at this time.   Follow Up Plan: 2wk telephone visit.   Lang Sieve, PharmD, BCGP Clinical Pharmacist  [phone]

## 2023-08-19 NOTE — Progress Notes (Signed)
   08/19/2023  Patient ID: Scott Griffin, male   DOB: 1961-01-25, 63 y.o.   MRN: [id number]  Attempted to contact patient for scheduled appointment for medication management. Left HIPAA compliant message for patient to return my call at their convenience.   Reviewed insurance information and patient assistance in advance of our schedule encounter 7/3/ 9am.   - insurance: did test claims, appears deductible met ($255). Trulicity ozempic /mounjaro copays would all be $47/month.   - ozempic  and trulicity  PAP: would need to make less than $63,450 annual household income. Additional requirements for trulicity  below, would need to fill out medical exception request form in addition to [facility] application. No PAP available for mounjaro.   The patient must meet ALL of the following criteria, and prescriber must check all boxes that apply. ? Diagnosed with type 2 diabetes mellitus ? On Trulicity  currently or within past 12 months ? Over age 12 ? Within past 12 months previously tried and failed at least one other GLP-1 receptor agonist after at least 3 months on each therapy  AND at least one of the following:  ? Has previously tried metformin  and had an inadequate treatment response, intolerance, or has a contraindication to metformin  ? Requires combination therapy AND has an A1C of 7.5% or greater ? Has established cardiovascular disease or multiple cardiovascular risk factors  Lang Sieve, PharmD, BCGP Clinical Pharmacist  [phone]

## 2023-08-27 ENCOUNTER — Ambulatory Visit (INDEPENDENT_AMBULATORY_CARE_PROVIDER_SITE_OTHER)

## 2023-08-27 VITALS — BP 138/71 | HR 85 | Ht 69.0 in | Wt 178.1 lb

## 2023-08-27 DIAGNOSIS — R310 Gross hematuria: Secondary | ICD-10-CM | POA: Diagnosis not present

## 2023-08-27 DIAGNOSIS — N3001 Acute cystitis with hematuria: Secondary | ICD-10-CM

## 2023-08-27 MED ORDER — TAMSULOSIN HCL 0.4 MG PO CAPS: 0.4000 mg | ORAL_CAPSULE | Freq: Every day | ORAL | 3 refills | Status: DC

## 2023-08-27 MED ORDER — AMOXICILLIN-POT CLAVULANATE 875-125 MG PO TABS: 1.0000 | ORAL_TABLET | Freq: Two times a day (BID) | ORAL | 0 refills | Status: AC

## 2023-08-27 NOTE — Progress Notes (Signed)
   Established Patient Office Visit  Subjective   Patient ID: Scott Griffin, male    DOB: 09/14/1960  Age: 63 y.o. MRN: [id number]  Chief Complaint  Patient presents with   Medical Management of Chronic Issues    Pt here states for about 2 weeks blood in urine     HPI  Urinary symptoms  He reports new onset flank pain and hematuria. The current episode started a few weeks ago and is staying constant. Patient states symptoms are moderate in intensity, occurring every day. He  has not been recently treated for similar symptoms.    Associated symptoms: No abdominal pain Yes back pain  No chills No constipation  No cramping No diarrhea  No discharge No fever  Yes hematuria No nausea  No vomiting    --------------------------------------------------------------------------------------- Patient Active Problem List   Diagnosis Date Noted   Otitis externa 08/07/2023   Low back pain due to bilateral sciatica 08/07/2023   Uncontrolled type 2 diabetes mellitus with hyperglycemia (HCC) 12/20/2018   Essential hypertension, benign 12/20/2018   Mixed hyperlipidemia 12/20/2018      ROS    Objective:     BP 138/71   Pulse 85   Ht 5' 9 (1.753 m)   Wt 178 lb 1.9 oz (80.8 kg)   SpO2 96%   BMI 26.30 kg/m  BP Readings from Last 3 Encounters:  08/27/23 138/71  08/06/23 123/72  05/13/23 (!) 144/85   Wt Readings from Last 3 Encounters:  08/27/23 178 lb 1.9 oz (80.8 kg)  08/06/23 182 lb (82.6 kg)  05/13/23 186 lb (84.4 kg)      Physical Exam Vitals and nursing note reviewed.  Constitutional:      Appearance: Normal appearance.  HENT:     Head: Normocephalic.  Eyes:     Extraocular Movements: Extraocular movements intact.     Pupils: Pupils are equal, round, and reactive to light.  Cardiovascular:     Rate and Rhythm: Normal rate and regular rhythm.  Pulmonary:     Effort: Pulmonary effort is normal.     Breath sounds: Normal breath sounds.  Abdominal:      Tenderness: There is left CVA tenderness. There is no right CVA tenderness.  Musculoskeletal:     Cervical back: Normal range of motion and neck supple.  Neurological:     Mental Status: He is alert and oriented to person, place, and time.  Psychiatric:        Mood and Affect: Mood normal.        Thought Content: Thought content normal.      No results found for any visits on 08/27/23.    The 10-year ASCVD risk score (Arnett DK, et al., 2019) is: 47.2%    Assessment & Plan:   Problem List Items Addressed This Visit   None Visit Diagnoses       Gross hematuria    -  Primary   Relevant Orders   Urinalysis     Acute cystitis with hematuria       Oral antibiotic and Flomax  sent in to pharmacy.   Recommend increasing water during the day.   Recommend urology consult if no improvement.   Relevant Medications   tamsulosin  (FLOMAX ) 0.4 MG CAPS capsule   amoxicillin -clavulanate (AUGMENTIN ) 875-125 MG tablet   Other Relevant Orders   Urine Culture       Return for as needed.    Leita Longs, FNP

## 2023-08-27 NOTE — Patient Instructions (Signed)
 Oral antibiotic and Flomax  sent in to pharmacy.   Recommend increasing water during the day.   Recommend urology consult if no improvement.

## 2023-08-31 LAB — URINALYSIS
Bilirubin, UA: NEGATIVE
Ketones, UA: NEGATIVE
Nitrite, UA: POSITIVE — AB
Specific Gravity, UA: >1.03 — ABNORMAL HIGH (ref 1.005–1.030)
Urobilinogen, Ur: 0.2 mg/dL (ref 0.2–1.0)
pH, UA: 5.5 (ref 5.0–7.5)

## 2023-09-02 ENCOUNTER — Other Ambulatory Visit: Payer: Self-pay

## 2023-09-02 ENCOUNTER — Telehealth: Payer: Self-pay

## 2023-09-02 LAB — URINE CULTURE

## 2023-09-02 NOTE — Progress Notes (Signed)
   09/02/2023  Patient ID: Scott Griffin, male   DOB: Aug 03, 1960, 63 y.o.   MRN: [id number]  Attempted to contact patient for scheduled appointment for medication management. Unable to leave VM.  Future Appointments  Date Time Provider Department Center  09/08/2023 10:40 AM RPC-ANNUAL FERDIE VISIT RPC-RPC RPC  10/07/2023  9:40 AM Zarwolo, Gloria, FNP RPC-RPC RPC   Lang Sieve, PharmD, BCGP Clinical Pharmacist  [phone]

## 2023-09-03 ENCOUNTER — Ambulatory Visit: Payer: Self-pay

## 2023-09-03 DIAGNOSIS — N3001 Acute cystitis with hematuria: Secondary | ICD-10-CM

## 2023-09-03 MED ORDER — NITROFURANTOIN MONOHYD MACRO 100 MG PO CAPS: 100.0000 mg | ORAL_CAPSULE | Freq: Two times a day (BID) | ORAL | 0 refills | Status: AC

## 2023-09-08 ENCOUNTER — Ambulatory Visit

## 2023-09-10 ENCOUNTER — Other Ambulatory Visit: Payer: Self-pay | Admitting: Family Medicine

## 2023-09-10 DIAGNOSIS — M5441 Lumbago with sciatica, right side: Secondary | ICD-10-CM

## 2023-09-10 DIAGNOSIS — E1165 Type 2 diabetes mellitus with hyperglycemia: Secondary | ICD-10-CM

## 2023-09-10 MED ORDER — ATORVASTATIN CALCIUM 20 MG PO TABS: 20.0000 mg | ORAL_TABLET | Freq: Every day | ORAL | 1 refills | Status: DC

## 2023-09-10 MED ORDER — METFORMIN HCL 1000 MG PO TABS: 1000.0000 mg | ORAL_TABLET | Freq: Two times a day (BID) | ORAL | 3 refills | Status: DC

## 2023-09-10 MED ORDER — GLIPIZIDE ER 10 MG PO TB24: 10.0000 mg | ORAL_TABLET | Freq: Every day | ORAL | 1 refills | Status: DC

## 2023-09-10 NOTE — Telephone Encounter (Signed)
 Copied from CRM [id number]. Topic: Clinical - Medication Refill >> Sep 10, 2023 12:56 PM Delon T wrote: Patient is out of medication  Medication: glipiZIDE  (GLUCOTROL  XL) 10 MG 24 hr tablet metFORMIN  (GLUCOPHAGE ) 1000 MG tablet Semaglutide ,0.25 or 0.5MG /DOS, (OZEMPIC , 0.25 OR 0.5 MG/DOSE,) 2 MG/3ML SOPN atorvastatin  (LIPITOR) 20 MG tablet naproxen  (NAPROSYN ) 500 MG tablet  Has the patient contacted their pharmacy? Yes (Agent: If no, request that the patient contact the pharmacy for the refill. If patient does not wish to contact the pharmacy document the reason why and proceed with request.) (Agent: If yes, when and what did the pharmacy advise?)  This is the patient's preferred pharmacy:  [facility]- phone [phone]  fax [phone]  Is this the correct pharmacy for this prescription? Yes If no, delete pharmacy and type the correct one.   Has the prescription been filled recently? Yes  Is the patient out of the medication? Yes  Has the patient been seen for an appointment in the last year OR does the patient have an upcoming appointment? Yes  Can we respond through MyChart? Yes  Agent: Please be advised that Rx refills may take up to 3 business days. We ask that you follow-up with your pharmacy.

## 2023-09-11 ENCOUNTER — Other Ambulatory Visit: Payer: Self-pay | Admitting: Family Medicine

## 2023-09-11 DIAGNOSIS — M5442 Lumbago with sciatica, left side: Secondary | ICD-10-CM

## 2023-09-11 DIAGNOSIS — E1165 Type 2 diabetes mellitus with hyperglycemia: Secondary | ICD-10-CM

## 2023-09-11 DIAGNOSIS — G8929 Other chronic pain: Secondary | ICD-10-CM

## 2023-09-11 MED ORDER — NAPROXEN 500 MG PO TABS: 500.0000 mg | ORAL_TABLET | Freq: Two times a day (BID) | ORAL | 2 refills | Status: DC

## 2023-09-11 MED ORDER — OZEMPIC (0.25 OR 0.5 MG/DOSE) 2 MG/3ML ~~LOC~~ SOPN: 0.5000 mg | PEN_INJECTOR | SUBCUTANEOUS | 0 refills | Status: DC

## 2023-09-20 LAB — HM DIABETES EYE EXAM

## 2023-09-24 ENCOUNTER — Other Ambulatory Visit: Payer: Self-pay | Admitting: Family Medicine

## 2023-09-24 DIAGNOSIS — M5442 Lumbago with sciatica, left side: Secondary | ICD-10-CM

## 2023-09-26 ENCOUNTER — Other Ambulatory Visit: Payer: Self-pay | Admitting: Family Medicine

## 2023-09-26 DIAGNOSIS — I10 Essential (primary) hypertension: Secondary | ICD-10-CM

## 2023-10-01 ENCOUNTER — Telehealth: Payer: Self-pay

## 2023-10-01 NOTE — Telephone Encounter (Signed)
 Copied from CRM [id number]. Topic: Clinical - Medication Question >> Oct 01, 2023 11:34 AM Charlet HERO wrote: Reason for CRM: [facility] Pharmacist calling from [phone] calling about glasipitide she is stating that it says that he takes one pill per day but the script is for 60 pills  which would be twice a day would like clarification on the dosage. Also if it is not correct she states that a new script over .

## 2023-10-04 ENCOUNTER — Telehealth: Payer: Self-pay | Admitting: Family Medicine

## 2023-10-04 NOTE — Telephone Encounter (Signed)
 Copied from CRM [id number]. Topic: Clinical - Medication Question >> Oct 04, 2023 10:23 AM Donee H wrote: Reason for CRM: Therisa Pharmacist from [facility] calling regarding medication  glipiZIDE  (GLUCOTROL  XL) 10 MG 24 hr tablet. She is wanting clarifty on medication. She states per the instructions it states patient is to take 1 pill everyday with breakfast. But previous prescription stated 2 pill with breakfast. She just want to be clear if the patient is suppose to go down to 1 or stay with 2. If it's not correct she states can a new prescription be sent over. Callback number if need to follow with her is [phone]

## 2023-10-04 NOTE — Telephone Encounter (Signed)
 Spoke to pharmacist

## 2023-10-04 NOTE — Telephone Encounter (Signed)
Spoken to pharmacist-

## 2023-10-07 ENCOUNTER — Encounter: Payer: Self-pay | Admitting: Family Medicine

## 2023-10-07 ENCOUNTER — Ambulatory Visit (INDEPENDENT_AMBULATORY_CARE_PROVIDER_SITE_OTHER): Admitting: Family Medicine

## 2023-10-07 VITALS — BP 107/67 | HR 83 | Ht 69.0 in | Wt 180.0 lb

## 2023-10-07 DIAGNOSIS — E1165 Type 2 diabetes mellitus with hyperglycemia: Secondary | ICD-10-CM | POA: Diagnosis not present

## 2023-10-07 DIAGNOSIS — E559 Vitamin D deficiency, unspecified: Secondary | ICD-10-CM | POA: Diagnosis not present

## 2023-10-07 DIAGNOSIS — Z7984 Long term (current) use of oral hypoglycemic drugs: Secondary | ICD-10-CM

## 2023-10-07 DIAGNOSIS — E7849 Other hyperlipidemia: Secondary | ICD-10-CM | POA: Diagnosis not present

## 2023-10-07 DIAGNOSIS — E038 Other specified hypothyroidism: Secondary | ICD-10-CM | POA: Diagnosis not present

## 2023-10-07 DIAGNOSIS — I10 Essential (primary) hypertension: Secondary | ICD-10-CM

## 2023-10-07 MED ORDER — LANCETS MISC. MISC: 1.0000 | Freq: Three times a day (TID) | 0 refills | Status: AC

## 2023-10-07 MED ORDER — BLOOD GLUCOSE TEST VI STRP: 1.0000 | ORAL_STRIP | Freq: Three times a day (TID) | 0 refills | Status: AC

## 2023-10-07 MED ORDER — LANCET DEVICE MISC: 1.0000 | Freq: Three times a day (TID) | 0 refills | Status: AC

## 2023-10-07 MED ORDER — BLOOD GLUCOSE MONITORING SUPPL DEVI: 1.0000 | Freq: Three times a day (TID) | 0 refills | Status: AC

## 2023-10-07 MED ORDER — OZEMPIC (1 MG/DOSE) 2 MG/1.5ML ~~LOC~~ SOPN: 1.0000 mg | PEN_INJECTOR | SUBCUTANEOUS | 0 refills | Status: DC

## 2023-10-07 NOTE — Progress Notes (Signed)
 Established Patient Office Visit  Subjective:  Patient ID: Scott Griffin, male    DOB: [birth year]  Age: 63 y.o. MRN: [id number]  CC:  Chief Complaint  Patient presents with   Diabetes   Hypertension    Has experienced some dizziness recently, possibly BP too low    HPI Scott Griffin is a 63 y.o. male with a past medical history of hypertension, type II diabetes, and hyperlipidemia, presents today for follow-up of his chronic medical conditions. He reports that his blood sugar has ranged with a high of 180 mg/dL and a low of 882 mg/dL. He denies increased thirst (polydipsia) and increased hunger (polyphagia) but does endorse increased nighttime urination (nocturia). He states that he has been compliant with his current treatment regimen.  The patient also reports experiencing dizziness a few weeks ago, though he did not check his blood pressure at that time to determine whether it may have been related to hypertension. He admits to minimal fluid intake. Today, the patient is asymptomatic in clinic.   Past Medical History:  Diagnosis Date   Diabetes mellitus without complication (HCC)    High cholesterol    Hypertension     Past Surgical History:  Procedure Laterality Date   BACK SURGERY  [phone]   lumbar disk   OPEN REDUCTION INTERNAL FIXATION (ORIF) SCAPHOID WITH ILIAC CREST BONE GRAFT Left 05/16/2014   Procedure: OPEN REDUCTION INTERNAL FIXATION (ORIF) LEFT SCAPHOID WITH ILIAC CREST BONE GRAFT;  Surgeon: Kay CHRISTELLA Cummins, MD;  Location: [facility];  Service: Orthopedics;  Laterality: Left;    Family History  Problem Relation Age of Onset   Hypertension Mother    Diabetes Mother    Hypertension Father    Diabetes Father    Diabetes Sister     Social History   Socioeconomic History   Marital status: Married    Spouse name: Not on file   Number of children: Not on file   Years of education: Not on file   Highest education level: Not on file   Occupational History   Not on file  Tobacco Use   Smoking status: Every Day    Current packs/day: 1.00    Types: Cigarettes   Smokeless tobacco: Never  Substance and Sexual Activity   Alcohol use: No   Drug use: No   Sexual activity: Not on file  Other Topics Concern   Not on file  Social History Narrative   Not on file   Social Drivers of Health   Financial Resource Strain: Not on file  Food Insecurity: Not on file  Transportation Needs: Not on file  Physical Activity: Not on file  Stress: Not on file  Social Connections: Not on file  Intimate Partner Violence: Not on file    Outpatient Medications Prior to Visit  Medication Sig Dispense Refill   Accu-Chek FastClix Lancets MISC USE TO CHECK BLOOD SUGAR FOUR TIMES DAILY AS DIRECTED 150 each 3   amLODipine (NORVASC) 5 MG tablet Take 5 mg by mouth daily.     aspirin 325 MG tablet Take 325 mg by mouth daily.     atorvastatin  (LIPITOR) 20 MG tablet Take 1 tablet (20 mg total) by mouth daily. 90 tablet 1   benazepril (LOTENSIN) 40 MG tablet Take 40 mg by mouth every morning.     cyclobenzaprine  (FLEXERIL ) 5 MG tablet Take 1 tablet (5 mg total) by mouth 3 (three) times daily as needed for muscle spasms. 30 tablet 2  famotidine (PEPCID) 40 MG tablet Take 40 mg by mouth every morning.     gabapentin  (NEURONTIN ) 100 MG capsule Take 1 capsule (100 mg total) by mouth 3 (three) times daily. 90 capsule 3   glipiZIDE  (GLUCOTROL  XL) 10 MG 24 hr tablet Take 1 tablet (10 mg total) by mouth daily with breakfast. 60 tablet 1   glucose blood (ACCU-CHEK GUIDE) test strip USE TO TEST BLOOD SUGAR FOUR TIMES DAILY AS DIRECTED 200 strip 1   meloxicam  (MOBIC ) 15 MG tablet Take 1 tablet (15 mg total) by mouth daily. 30 tablet 0   metFORMIN  (GLUCOPHAGE ) 1000 MG tablet Take 1 tablet (1,000 mg total) by mouth 2 (two) times daily with a meal. 60 tablet 3   naproxen  (NAPROSYN ) 500 MG tablet Take 1 tablet (500 mg total) by mouth 2 (two) times daily with a  meal. 60 tablet 2   olmesartan  (BENICAR ) 20 MG tablet Take 1 tablet by mouth daily. 30 tablet 1   tamsulosin  (FLOMAX ) 0.4 MG CAPS capsule Take 1 capsule (0.4 mg total) by mouth daily. 30 capsule 3   Vitamin D , Ergocalciferol , (DRISDOL ) 1.25 MG (50000 UNIT) CAPS capsule Take 1 capsule (50,000 Units total) by mouth every 7 (seven) days. 20 capsule 1   Semaglutide ,0.25 or 0.5MG /DOS, (OZEMPIC , 0.25 OR 0.5 MG/DOSE,) 2 MG/3ML SOPN Inject 0.25 mg into the skin once a week.     oxyCODONE -acetaminophen  (PERCOCET/ROXICET) 5-325 MG tablet Take 1 tablet every 4 (four) hours as needed by mouth. (Patient not taking: Reported on 10/07/2023) 15 tablet 0   Dulaglutide  (TRULICITY ) 0.75 MG/0.5ML SOAJ Inject 0.75 mg into the skin once a week. (Patient not taking: Reported on 08/19/2023) 2 mL 0   liraglutide (VICTOZA) 18 MG/3ML SOPN Inject 1.8 mg into the skin daily.  (Patient not taking: Reported on 08/19/2023)     Semaglutide ,0.25 or 0.5MG /DOS, (OZEMPIC , 0.25 OR 0.5 MG/DOSE,) 2 MG/3ML SOPN Inject 0.5 mg into the skin once a week. (Patient not taking: Reported on 10/07/2023) 3 mL 0   No facility-administered medications prior to visit.    No Known Allergies  ROS Review of Systems  Constitutional:  Negative for fatigue and fever.  Eyes:  Negative for visual disturbance.  Respiratory:  Negative for chest tightness and shortness of breath.   Cardiovascular:  Negative for chest pain and palpitations.  Neurological:  Negative for dizziness and headaches.      Objective:    Physical Exam HENT:     Head: Normocephalic.     Right Ear: External ear normal.     Left Ear: External ear normal.     Nose: No congestion or rhinorrhea.     Mouth/Throat:     Mouth: Mucous membranes are moist.  Cardiovascular:     Rate and Rhythm: Regular rhythm.     Heart sounds: No murmur heard. Pulmonary:     Effort: No respiratory distress.     Breath sounds: Normal breath sounds.  Neurological:     Mental Status: He is alert.      BP 107/67   Pulse 83   Ht 5' 9 (1.753 m)   Wt 180 lb (81.6 kg)   SpO2 98%   BMI 26.58 kg/m  Wt Readings from Last 3 Encounters:  10/07/23 180 lb (81.6 kg)  08/27/23 178 lb 1.9 oz (80.8 kg)  08/06/23 182 lb (82.6 kg)    Lab Results  Component Value Date   TSH 1.320 05/13/2023   Lab Results  Component Value Date  WBC 9.1 05/13/2023   HGB 15.6 05/13/2023   HCT 48.4 05/13/2023   MCV 85 05/13/2023   PLT 219 05/13/2023   Lab Results  Component Value Date   NA 136 05/13/2023   K 5.4 (H) 05/13/2023   CO2 20 05/13/2023   GLUCOSE 217 (H) 05/13/2023   BUN 19 05/13/2023   CREATININE 1.09 05/13/2023   BILITOT 0.2 05/13/2023   ALKPHOS 95 05/13/2023   AST 13 05/13/2023   ALT 10 05/13/2023   PROT 7.2 05/13/2023   ALBUMIN 4.7 05/13/2023   CALCIUM  9.9 05/13/2023   ANIONGAP 10 12/29/2016   EGFR 75 05/13/2023   Lab Results  Component Value Date   CHOL 130 05/13/2023   Lab Results  Component Value Date   HDL 38 (L) 05/13/2023   Lab Results  Component Value Date   LDLCALC 63 05/13/2023   Lab Results  Component Value Date   TRIG 170 (H) 05/13/2023   Lab Results  Component Value Date   CHOLHDL 3.4 05/13/2023   Lab Results  Component Value Date   HGBA1C 12.9 (H) 05/13/2023      Assessment & Plan:  Essential hypertension, benign Assessment & Plan: The patient is encouraged to check his blood pressure whenever he feels dizzy or unwell, as dizziness may be related to fluctuations in blood pressure. He should also check his blood pressure prior to taking his antihypertensive medication to ensure safe administration. Encouraged to continue his current treatment regimen as prescribed. Recommended maintaining a low-sodium diet and increasing physical activity as tolerated. Educated on monitoring for warning signs such as severe headache, chest pain, shortness of breath, vision changes, or persistent dizziness, and to seek urgent care if these  occur.    Uncontrolled type 2 diabetes mellitus with hyperglycemia (HCC) Assessment & Plan: Encouraged to  taking Ozempic1mg  weekly, Glipizide  10 mg daily, and Metformin  1000 mg twice daily.  Recommend decreasing intake of high-sugar foods and beverages and increasing physical activity to support blood sugar control.  Advise the patient to report to the emergency department if blood sugar remains consistently above 250, especially if accompanied by nausea, vomiting, abdominal pain, fruity-smelling breath, blurred vision, weight loss, or confusion, as these may indicate diabetic ketoacidosis.   Orders: -     Blood Glucose Monitoring Suppl; 1 each by Does not apply route in the morning, at noon, and at bedtime. May substitute to any manufacturer covered by patient's insurance.  Dispense: 1 each; Refill: 0 -     Blood Glucose Test; 1 each by In Vitro route in the morning, at noon, and at bedtime. May substitute to any manufacturer covered by patient's insurance.  Dispense: 100 strip; Refill: 0 -     Lancet Device; 1 each by Does not apply route in the morning, at noon, and at bedtime. May substitute to any manufacturer covered by patient's insurance.  Dispense: 1 each; Refill: 0 -     Lancets Misc.; 1 each by Does not apply route in the morning, at noon, and at bedtime. May substitute to any manufacturer covered by patient's insurance.  Dispense: 100 each; Refill: 0 -     Hemoglobin A1c -     Ozempic  (1 MG/DOSE); Inject 1 mg into the skin once a week.  Dispense: 3 mL; Refill: 0  Vitamin D  deficiency -     VITAMIN D  25 Hydroxy (Vit-D Deficiency, Fractures)  TSH (thyroid -stimulating hormone deficiency) -     TSH + free T4  Other hyperlipidemia -  Lipid panel -     CMP14+EGFR -     CBC with Differential/Platelet  Note: This chart has been completed using Engineer, civil (consulting) software, and while attempts have been made to ensure accuracy, certain words and phrases may not be  transcribed as intended.    Follow-up: Return in about 4 months (around 02/06/2024).   Fanny Agan, FNP

## 2023-10-07 NOTE — Assessment & Plan Note (Signed)
 The patient is encouraged to check his blood pressure whenever he feels dizzy or unwell, as dizziness may be related to fluctuations in blood pressure. He should also check his blood pressure prior to taking his antihypertensive medication to ensure safe administration. Encouraged to continue his current treatment regimen as prescribed. Recommended maintaining a low-sodium diet and increasing physical activity as tolerated. Educated on monitoring for warning signs such as severe headache, chest pain, shortness of breath, vision changes, or persistent dizziness, and to seek urgent care if these occur.

## 2023-10-07 NOTE — Patient Instructions (Addendum)
 I appreciate the opportunity to provide care to you today!    Follow up:  4 months  Labs: please stop by the lab today to get your blood drawn (CBC, CMP, TSH, Lipid profile, HgA1c, Vit D)   For a Healthier YOU, I Recommend: Reducing your intake of sugar, sodium, carbohydrates, and saturated fats. Increasing your fiber intake by incorporating more whole grains, fruits, and vegetables into your meals. Setting healthy goals with a focus on lowering your consumption of carbs, sugar, and unhealthy fats. Adding variety to your diet by including a wide range of fruits and vegetables. Cutting back on soda and limiting processed foods as much as possible. Staying active: In addition to taking your weight loss medication, aim for at least 150 minutes of moderate-intensity physical activity each week for optimal results.    Please follow up if your symptoms worsen or fail to improve.    Attached with your AVS, you will find valuable resources for self-education. I highly recommend dedicating some time to thoroughly examine them.   Please continue to a heart-healthy diet and increase your physical activities. Try to exercise for at least five days a week.    It was a pleasure to see you and I look forward to continuing to work together on your health and well-being. Please do not hesitate to call the office if you need care or have questions about your care.  In case of emergency, please visit the Emergency Department for urgent care, or contact our clinic at [phone] to schedule an appointment. We're here to help you!   Have a wonderful day and week. With Gratitude, Carolene Gitto MSN, FNP-BC

## 2023-10-07 NOTE — Assessment & Plan Note (Signed)
 Encouraged to  taking Ozempic1mg  weekly, Glipizide  10 mg daily, and Metformin  1000 mg twice daily.  Recommend decreasing intake of high-sugar foods and beverages and increasing physical activity to support blood sugar control.  Advise the patient to report to the emergency department if blood sugar remains consistently above 250, especially if accompanied by nausea, vomiting, abdominal pain, fruity-smelling breath, blurred vision, weight loss, or confusion, as these may indicate diabetic ketoacidosis.

## 2023-10-11 LAB — CMP14+EGFR

## 2023-10-11 LAB — LIPID PANEL

## 2023-10-12 LAB — CBC WITH DIFFERENTIAL/PLATELET
Basophils Absolute: 0 x10E3/uL (ref 0.0–0.2)
Basos: 0 %
EOS (ABSOLUTE): 0.2 x10E3/uL (ref 0.0–0.4)
Eos: 1 %
Hematocrit: 41.3 % (ref 37.5–51.0)
Hemoglobin: 13.2 g/dL (ref 13.0–17.7)
Immature Grans (Abs): 0 x10E3/uL (ref 0.0–0.1)
Immature Granulocytes: 0 %
Lymphocytes Absolute: 2.8 x10E3/uL (ref 0.7–3.1)
Lymphs: 26 %
MCH: 27.7 pg (ref 26.6–33.0)
MCHC: 32 g/dL (ref 31.5–35.7)
MCV: 87 fL (ref 79–97)
Monocytes Absolute: 0.8 x10E3/uL (ref 0.1–0.9)
Monocytes: 8 %
Neutrophils Absolute: 6.8 x10E3/uL (ref 1.4–7.0)
Neutrophils: 65 %
Platelets: 281 x10E3/uL (ref 150–450)
RBC: 4.77 x10E6/uL (ref 4.14–5.80)
RDW: 13.3 % (ref 11.6–15.4)
WBC: 10.6 x10E3/uL (ref 3.4–10.8)

## 2023-10-12 LAB — LIPID PANEL

## 2023-10-12 LAB — TSH+FREE T4

## 2023-10-12 LAB — CMP14+EGFR

## 2023-10-12 LAB — HEMOGLOBIN A1C
Est. average glucose Bld gHb Est-mCnc: 295 mg/dL
Hgb A1c MFr Bld: 11.9 % — ABNORMAL HIGH (ref 4.8–5.6)

## 2023-10-12 LAB — VITAMIN D 25 HYDROXY (VIT D DEFICIENCY, FRACTURES): Vit D, 25-Hydroxy: 41.4 ng/mL (ref 30.0–100.0)

## 2023-10-16 ENCOUNTER — Ambulatory Visit: Payer: Self-pay | Admitting: Family Medicine

## 2023-10-21 ENCOUNTER — Other Ambulatory Visit: Payer: Self-pay | Admitting: Family Medicine

## 2023-10-21 DIAGNOSIS — M5441 Lumbago with sciatica, right side: Secondary | ICD-10-CM

## 2023-11-02 ENCOUNTER — Telehealth: Payer: Self-pay

## 2023-11-02 NOTE — Telephone Encounter (Signed)
 Copied from CRM [id number]. Topic: Clinical - Medication Question >> Nov 02, 2023 12:27 PM Scott Griffin wrote: Reason for CRM:  Pharmacy needs to know if [facility] needs to take olmesartan  (BENICAR ) 20 MG tablet and benazepril (LOTENSIN) 40 MG tablet together. Please call and speak to anyone at [phone]

## 2023-11-03 ENCOUNTER — Ambulatory Visit (INDEPENDENT_AMBULATORY_CARE_PROVIDER_SITE_OTHER)

## 2023-11-03 DIAGNOSIS — Z23 Encounter for immunization: Secondary | ICD-10-CM | POA: Diagnosis not present

## 2023-11-03 NOTE — Progress Notes (Signed)
 Patient is in office today for a nurse visit for Immunization. Patient Injection was given in the  Left deltoid. Patient tolerated injection well.

## 2023-11-05 ENCOUNTER — Other Ambulatory Visit: Payer: Self-pay

## 2023-11-05 NOTE — Telephone Encounter (Signed)
 Clarification given to pharmacy.

## 2023-11-17 ENCOUNTER — Ambulatory Visit (INDEPENDENT_AMBULATORY_CARE_PROVIDER_SITE_OTHER)

## 2023-11-17 VITALS — Ht 69.0 in | Wt 186.0 lb

## 2023-11-17 DIAGNOSIS — Z7984 Long term (current) use of oral hypoglycemic drugs: Secondary | ICD-10-CM | POA: Diagnosis not present

## 2023-11-17 DIAGNOSIS — Z0000 Encounter for general adult medical examination without abnormal findings: Secondary | ICD-10-CM

## 2023-11-17 DIAGNOSIS — Z0001 Encounter for general adult medical examination with abnormal findings: Secondary | ICD-10-CM

## 2023-11-17 DIAGNOSIS — F17210 Nicotine dependence, cigarettes, uncomplicated: Secondary | ICD-10-CM | POA: Diagnosis not present

## 2023-11-17 DIAGNOSIS — E1165 Type 2 diabetes mellitus with hyperglycemia: Secondary | ICD-10-CM | POA: Diagnosis not present

## 2023-11-17 DIAGNOSIS — I10 Essential (primary) hypertension: Secondary | ICD-10-CM

## 2023-11-17 DIAGNOSIS — F17200 Nicotine dependence, unspecified, uncomplicated: Secondary | ICD-10-CM

## 2023-11-17 DIAGNOSIS — Z7985 Long-term (current) use of injectable non-insulin antidiabetic drugs: Secondary | ICD-10-CM

## 2023-11-17 MED ORDER — BLOOD PRESSURE MONITOR 3 DEVI: 0 refills | Status: AC

## 2023-11-17 NOTE — Progress Notes (Signed)
 Please attest and cosign this visit due to patients primary care provider not being immediately available at the time the visit was completed.   Subjective:   Scott Griffin is a 63 y.o. who presents for a Medicare Wellness preventive visit. As a reminder, Annual Wellness Visits don't include a physical exam, and some assessments may be limited, especially if this visit is performed virtually. We may recommend an in-person follow-up visit with your provider if needed.  Visit Complete: Virtual I connected with  Scott Griffin on 11/17/23 by a audio enabled telemedicine application and verified that I am speaking with the correct person using two identifiers.  Patient Location: Home  Provider Location: Home Office  I discussed the limitations of evaluation and management by telemedicine. The patient expressed understanding and agreed to proceed.  Vital Signs: Because this visit was a virtual/telehealth visit, some criteria may be missing or patient reported. Any vitals not documented were not able to be obtained and vitals that have been documented are patient reported. VideoDeclined- This patient declined Librarian, academic. Therefore the visit was completed with audio only.  Persons Participating in Visit: Patient.  AWV Questionnaire: No: Patient Medicare AWV questionnaire was not completed prior to this visit. Cardiac Risk Factors include: advanced age (>58men, >84 women);diabetes mellitus;dyslipidemia;hypertension;male gender;smoking/ tobacco exposure     Objective:    Today's Vitals   11/17/23 1127  Weight: 186 lb (84.4 kg)  Height: 5' 9 (1.753 m)   Body mass index is 27.47 kg/m.    11/17/2023   11:31 AM 02/04/2021    5:25 AM 10/01/2018    2:42 AM 12/29/2016   10:10 AM 10/24/2014    9:40 AM 05/16/2014    8:24 AM 05/10/2014   11:49 AM  Advanced Directives  Does Patient Have a Medical Advance Directive? No No No No  No  No  No   Would  patient like information on creating a medical advance directive? No - Patient declined No - Patient declined  No - Patient declined  No - patient declined information  Yes - Educational materials given  No - patient declined information      Data saved with a previous flowsheet row definition   Current Medications (verified) Outpatient Encounter Medications as of 11/17/2023  Medication Sig   Accu-Chek FastClix Lancets MISC USE TO CHECK BLOOD SUGAR FOUR TIMES DAILY AS DIRECTED   amLODipine (NORVASC) 5 MG tablet Take 5 mg by mouth daily.   aspirin 325 MG tablet Take 325 mg by mouth daily.   atorvastatin  (LIPITOR) 20 MG tablet Take 1 tablet (20 mg total) by mouth daily.   Blood Glucose Monitoring Suppl DEVI 1 each by Does not apply route in the morning, at noon, and at bedtime. May substitute to any manufacturer covered by patient's insurance.   Blood Pressure Monitoring (BLOOD PRESSURE MONITOR 3) DEVI Check blood pressure once daily 1 hour after taking blood pressure medication   cyclobenzaprine  (FLEXERIL ) 5 MG tablet Take 1 tablet (5 mg total) by mouth 3 (three) times daily as needed for muscle spasms.   famotidine (PEPCID) 40 MG tablet Take 40 mg by mouth every morning.   gabapentin  (NEURONTIN ) 100 MG capsule Take 1 capsule (100 mg total) by mouth 3 (three) times daily.   glipiZIDE  (GLUCOTROL  XL) 10 MG 24 hr tablet Take 1 tablet (10 mg total) by mouth daily with breakfast.   glucose blood (ACCU-CHEK GUIDE) test strip USE TO TEST BLOOD SUGAR FOUR TIMES DAILY  AS DIRECTED   meloxicam  (MOBIC ) 15 MG tablet Take 1 tablet (15 mg total) by mouth daily.   metFORMIN  (GLUCOPHAGE ) 1000 MG tablet Take 1 tablet (1,000 mg total) by mouth 2 (two) times daily with a meal.   naproxen  (NAPROSYN ) 500 MG tablet TAKE ONE TABLET (500 MG TOTAL) BY MOUTH TWICE DAILY AT 9AM & 5PM WITH MEALS   olmesartan  (BENICAR ) 20 MG tablet Take 1 tablet by mouth daily.   Semaglutide , 1 MG/DOSE, (OZEMPIC , 1 MG/DOSE,) 2 MG/1.5ML SOPN  Inject 1 mg into the skin once a week.   tamsulosin  (FLOMAX ) 0.4 MG CAPS capsule Take 1 capsule (0.4 mg total) by mouth daily.   Vitamin D , Ergocalciferol , (DRISDOL ) 1.25 MG (50000 UNIT) CAPS capsule Take 1 capsule (50,000 Units total) by mouth every 7 (seven) days.   oxyCODONE -acetaminophen  (PERCOCET/ROXICET) 5-325 MG tablet Take 1 tablet every 4 (four) hours as needed by mouth. (Patient not taking: Reported on 11/17/2023)   No facility-administered encounter medications on file as of 11/17/2023.   Allergies (verified) Patient has no known allergies.   History: Past Medical History:  Diagnosis Date   Diabetes mellitus without complication (HCC)    High cholesterol    Hypertension    Past Surgical History:  Procedure Laterality Date   BACK SURGERY  [phone]   lumbar disk   OPEN REDUCTION INTERNAL FIXATION (ORIF) SCAPHOID WITH ILIAC CREST BONE GRAFT Left 05/16/2014   Procedure: OPEN REDUCTION INTERNAL FIXATION (ORIF) LEFT SCAPHOID WITH ILIAC CREST BONE GRAFT;  Surgeon: Scott CHRISTELLA Cummins, MD;  Location: [facility];  Service: Orthopedics;  Laterality: Left;   Family History  Problem Relation Age of Onset   Hypertension Mother    Diabetes Mother    Hypertension Father    Diabetes Father    Diabetes Sister    Social History   Socioeconomic History   Marital status: Married    Spouse name: Not on file   Number of children: Not on file   Years of education: Not on file   Highest education level: Not on file  Occupational History   Not on file  Tobacco Use   Smoking status: Every Day    Current packs/day: 1.00    Average packs/day: 1 pack/day for 50.7 years (50.7 ttl pk-yrs)    Types: Cigarettes    Start date: 59   Smokeless tobacco: Never  Substance and Sexual Activity   Alcohol use: No   Drug use: No   Sexual activity: Not on file  Other Topics Concern   Not on file  Social History Narrative   Not on file   Social Drivers of Health   Financial Resource  Strain: Medium Risk (11/17/2023)   Overall Financial Resource Strain (CARDIA)    Difficulty of Paying Living Expenses: Somewhat hard  Food Insecurity: No Food Insecurity (11/17/2023)   Hunger Vital Sign    Worried About Running Out of Food in the Last Year: Never true    Ran Out of Food in the Last Year: Never true  Transportation Needs: No Transportation Needs (11/17/2023)   PRAPARE - Administrator, Civil Service (Medical): No    Lack of Transportation (Non-Medical): No  Physical Activity: Sufficiently Active (11/17/2023)   Exercise Vital Sign    Days of Exercise per Week: 7 days    Minutes of Exercise per Session: 30 min  Stress: No Stress Concern Present (11/17/2023)   [employer] of Occupational Health - Occupational Stress Questionnaire    Feeling  of Stress: Not at all  Social Connections: Moderately Integrated (11/17/2023)   Social Connection and Isolation Panel    Frequency of Communication with Friends and Family: Three times a week    Frequency of Social Gatherings with Friends and Family: Never    Attends Religious Services: More than 4 times per year    Active Member of [employer] or Organizations: No    Attends Engineer, structural: Never    Marital Status: Married   Tobacco Counseling Ready to quit: Yes Counseling given: Yes Referral placed for virtual cessation Clinical Intake: Pre-visit preparation completed: Yes Pain : No/denies pain   BMI - recorded: 27.47 Nutritional Status: BMI 25 -29 Overweight Nutritional Risks: None Diabetes: Yes CBG done?: No (telehealth visit.) Did pt. bring in CBG monitor from home?: No Lab Results  Component Value Date   HGBA1C 11.9 (H) 10/07/2023   HGBA1C 12.9 (H) 05/13/2023   HGBA1C 9.5 10/03/2018    How often do you need to have someone help you when you read instructions, pamphlets, or other written materials from your doctor or pharmacy?: 1 - Never Interpreter Needed?: No Information entered by :: Harjit Leider W  CMA (AAMA)  Activities of Daily Living     11/17/2023   11:31 AM  In your present state of health, do you have any difficulty performing the following activities:  Hearing? 0  Vision? 0  Difficulty concentrating or making decisions? 0  Walking or climbing stairs? 0  Dressing or bathing? 0  Doing errands, shopping? 0  Preparing Food and eating ? N  Using the Toilet? N  In the past six months, have you accidently leaked urine? N  Do you have problems with loss of bowel control? N  Managing your Medications? N  Managing your Finances? N  Housekeeping or managing your Housekeeping? N   Patient Care Team: Bacchus, Meade PEDLAR, FNP as PCP - General (Family Medicine)  I have updated your Care Teams any recent Medical Services you may have received from other providers in the past year.     Assessment:   This is a routine wellness examination for [place].  Hearing/Vision screen Hearing Screening - Comments:: Patient denies any hearing difficulties.    Goals Addressed               This Visit's Progress     Work on lowering my A1C down (pt-stated)        Start watching what I eat to get my A1C lower        Depression Screen     11/17/2023   11:38 AM 05/13/2023    9:04 AM  PHQ 2/9 Scores  PHQ - 2 Score 0 1  PHQ- 9 Score 0 8     Fall Risk     11/17/2023   11:38 AM 05/13/2023    9:04 AM  Fall Risk   Falls in the past year? 0 1  Number falls in past yr: 0 1  Injury with Fall? 0 0  Risk for fall due to : No Fall Risks History of fall(s)  Follow up Falls evaluation completed;Education provided;Falls prevention discussed Falls evaluation completed;Education provided;Falls prevention discussed    MEDICARE RISK AT HOME:  Medicare Risk at Home Any stairs in or around the home?: Yes If so, are there any without handrails?: No Home free of loose throw rugs in walkways, pet beds, electrical cords, etc?: Yes Adequate lighting in your home to reduce risk of falls?:  Yes Life alert?:  No Use of a cane, walker or w/c?: No Grab bars in the bathroom?: No Shower chair or bench in shower?: No Elevated toilet seat or a handicapped toilet?: No  TIMED UP AND GO: Was the test performed?  No  Cognitive Function: 6CIT completed        11/17/2023   11:38 AM  6CIT Screen  What Year? 0 points  What month? 0 points  What time? 0 points  Count back from 20 0 points  Months in reverse 0 points  Repeat phrase 0 points  Total Score 0 points   Immunizations Immunization History  Administered Date(s) Administered   INFLUENZA, HIGH DOSE SEASONAL PF 11/03/2023   Tdap 01/05/2014   Screening Tests Health Maintenance  Topic Date Due   FOOT EXAM  Never done   Diabetic kidney evaluation - Urine ACR  Never done   Zoster Vaccines- Shingrix (1 of 2) Never done   Pneumococcal Vaccine: 50+ Years (2 of 2 - PCV) 04/05/2018   COVID-19 Vaccine (1 - 2024-25 season) Never done   DTaP/Tdap/Td (2 - Td or Tdap) 01/06/2024   Lung Cancer Screening  01/18/2024   HEMOGLOBIN A1C  04/08/2024   Diabetic kidney evaluation - eGFR measurement  05/12/2024   OPHTHALMOLOGY EXAM  09/19/2024   Medicare Annual Wellness (AWV)  11/16/2024   Colonoscopy  09/05/2028   Influenza Vaccine  Completed   Hepatitis C Screening  Completed   HIV Screening  Completed   Hepatitis B Vaccines 19-59 Average Risk  Aged Out   HPV VACCINES  Aged Out   Meningococcal B Vaccine  Aged Out   Health Maintenance Health Maintenance Due  Topic Date Due   FOOT EXAM  Never done   Diabetic kidney evaluation - Urine ACR  Never done   Zoster Vaccines- Shingrix (1 of 2) Never done   Pneumococcal Vaccine: 50+ Years (2 of 2 - PCV) 04/05/2018   COVID-19 Vaccine (1 - 2024-25 season) Never done   Health Maintenance Items Addressed: Podiatry referral placed  Additional Screening: Vision Screening: Recommended annual ophthalmology exams for early detection of glaucoma and other disorders of the eye. Would you like  a referral to an eye doctor? A list of eye care providers were provided to the patient   Dental Screening: Recommended annual dental exams for proper oral hygiene  Community Resource Referral / Chronic Care Management: CRR required this visit?  No   CCM required this visit?  No  Plan:   I have personally reviewed and noted the following in the patient's chart:   Medical and social history Use of alcohol, tobacco or illicit drugs  Current medications and supplements including opioid prescriptions. Patient is not currently taking opioid prescriptions. Functional ability and status Nutritional status Physical activity Advanced directives List of other physicians Hospitalizations, surgeries, and ER visits in previous 12 months Vitals Screenings to include cognitive, depression, and falls Referrals and appointments  In addition, I have reviewed and discussed with patient certain preventive protocols, quality metrics, and best practice recommendations. A written personalized care plan for preventive services as well as general preventive health recommendations were provided to patient.   Cambelle Suchecki, CMA   11/17/2023   After Visit Summary: (MyChart) Due to this being a telephonic visit, the after visit summary with patients personalized plan was offered to patient via MyChart   Notes: Please refer to Routing Comments.

## 2023-11-17 NOTE — Patient Instructions (Addendum)
 Mr. Scott Griffin,  Thank you for taking the time for your Medicare Wellness Visit. I appreciate your continued commitment to your health goals. Please review the care plan we discussed, and feel free to reach out if I can assist you further.  Medicare recommends these wellness visits once per year to help you and your care team stay ahead of potential health issues. These visits are designed to focus on prevention, allowing your provider to concentrate on managing your acute and chronic conditions during your regular appointments.  Please note that Annual Wellness Visits do not include a physical exam. Some assessments may be limited, especially if the visit was conducted virtually. If needed, we may recommend a separate in-person follow-up with your provider.  Ongoing Care  Seeing your primary care provider every 3 to 6 months helps us  monitor your health and provide consistent, personalized care.   Referrals   Podiatrist Referral [facility] ([place]) [address] [place], [place] [address] Main Phone:[phone]  A referral was placed to assist you with smoking cessation. They will contact you within the next couple of days    Diabetic Nutrition Referral Santana Duke @  [facility] at [facility] [address] [place]  [facility]: [phone]  Recommended Screenings:  Health Maintenance  Topic Date Due   Complete foot exam   Never done   Yearly kidney health urinalysis for diabetes  Never done   Zoster (Shingles) Vaccine (1 of 2) Never done   Pneumococcal Vaccine for age over 102 (2 of 2 - PCV) 04/05/2018   COVID-19 Vaccine (1 - 2024-25 season) Never done   DTaP/Tdap/Td vaccine (2 - Td or Tdap) 01/06/2024   Screening for Lung Cancer  01/18/2024   Hemoglobin A1C  04/08/2024   Yearly kidney function blood test for diabetes  05/12/2024   Eye exam for diabetics  09/19/2024   Medicare Annual Wellness Visit   11/16/2024   Colon Cancer Screening  09/05/2028   Flu Shot  Completed   Hepatitis C Screening  Completed   HIV Screening  Completed   Hepatitis B Vaccine  Aged Out   HPV Vaccine  Aged Out   Meningitis B Vaccine  Aged Out       11/17/2023   11:31 AM  Advanced Directives  Does Patient Have a Medical Advance Directive? No  Would patient like information on creating a medical advance directive? No - Patient declined    Advance Care Planning is important because it: Ensures you receive medical care that aligns with your values, goals, and preferences. Provides guidance to your family and loved ones, reducing the emotional burden of decision-making during critical moments.  Vision: Annual vision screenings are recommended for early detection of glaucoma, cataracts, and diabetic retinopathy. These exams can also reveal signs of chronic conditions such as diabetes and high blood pressure.  Dental: Annual dental screenings help detect early signs of oral cancer, gum disease, and other conditions linked to overall health, including heart disease and diabetes.  Please see the attached documents for additional preventive care recommendations.

## 2023-11-26 ENCOUNTER — Other Ambulatory Visit: Payer: Self-pay

## 2023-11-26 ENCOUNTER — Other Ambulatory Visit: Payer: Self-pay | Admitting: Family Medicine

## 2023-11-26 DIAGNOSIS — I10 Essential (primary) hypertension: Secondary | ICD-10-CM

## 2023-11-26 DIAGNOSIS — M5441 Lumbago with sciatica, right side: Secondary | ICD-10-CM

## 2023-11-26 DIAGNOSIS — N3001 Acute cystitis with hematuria: Secondary | ICD-10-CM

## 2023-12-21 ENCOUNTER — Encounter: Attending: Internal Medicine | Admitting: Nutrition

## 2024-01-01 ENCOUNTER — Other Ambulatory Visit: Payer: Self-pay | Admitting: Family Medicine

## 2024-01-01 DIAGNOSIS — E1165 Type 2 diabetes mellitus with hyperglycemia: Secondary | ICD-10-CM

## 2024-01-06 NOTE — Progress Notes (Signed)
   01/06/2024  Patient ID: Scott Griffin, male   DOB: [birth year], 63 y.o.   MRN: [id number]  Pharmacy Quality Measure Review  This patient is appearing on a report for being at risk of failing the Glycemic Status Assessment in Diabetes measure this calendar year.   Lab Results  Component Value Date   HGBA1C 11.9 (H) 10/07/2023   HGBA1C 12.9 (H) 05/13/2023   HGBA1C 9.5 10/03/2018   Future Appointments  Date Time Provider Department Center  02/07/2024  9:00 AM Bacchus, Meade PEDLAR, FNP [facility] [address]  11/20/2024  3:10 PM [facility]-ANNUAL WELLNESS VISIT [facility] [address]    Upcoming appt noted, made note requesting uACR along with A1c. Reminder set to review.   Lang Sieve, PharmD, BCGP Clinical Pharmacist  [phone]

## 2024-01-29 ENCOUNTER — Other Ambulatory Visit: Payer: Self-pay | Admitting: Family Medicine

## 2024-01-29 DIAGNOSIS — I10 Essential (primary) hypertension: Secondary | ICD-10-CM

## 2024-02-07 ENCOUNTER — Encounter: Payer: Self-pay | Admitting: Family Medicine

## 2024-02-07 ENCOUNTER — Telehealth (INDEPENDENT_AMBULATORY_CARE_PROVIDER_SITE_OTHER): Admitting: Family Medicine

## 2024-02-07 DIAGNOSIS — Z7984 Long term (current) use of oral hypoglycemic drugs: Secondary | ICD-10-CM

## 2024-02-07 DIAGNOSIS — F17210 Nicotine dependence, cigarettes, uncomplicated: Secondary | ICD-10-CM | POA: Diagnosis not present

## 2024-02-07 DIAGNOSIS — E559 Vitamin D deficiency, unspecified: Secondary | ICD-10-CM | POA: Diagnosis not present

## 2024-02-07 DIAGNOSIS — M5442 Lumbago with sciatica, left side: Secondary | ICD-10-CM

## 2024-02-07 DIAGNOSIS — Z7985 Long-term (current) use of injectable non-insulin antidiabetic drugs: Secondary | ICD-10-CM | POA: Diagnosis not present

## 2024-02-07 DIAGNOSIS — N3001 Acute cystitis with hematuria: Secondary | ICD-10-CM

## 2024-02-07 DIAGNOSIS — E1165 Type 2 diabetes mellitus with hyperglycemia: Secondary | ICD-10-CM

## 2024-02-07 DIAGNOSIS — N3000 Acute cystitis without hematuria: Secondary | ICD-10-CM | POA: Diagnosis not present

## 2024-02-07 DIAGNOSIS — R399 Unspecified symptoms and signs involving the genitourinary system: Secondary | ICD-10-CM

## 2024-02-07 DIAGNOSIS — E038 Other specified hypothyroidism: Secondary | ICD-10-CM

## 2024-02-07 DIAGNOSIS — M5441 Lumbago with sciatica, right side: Secondary | ICD-10-CM | POA: Diagnosis not present

## 2024-02-07 DIAGNOSIS — E7849 Other hyperlipidemia: Secondary | ICD-10-CM

## 2024-02-07 DIAGNOSIS — I10 Essential (primary) hypertension: Secondary | ICD-10-CM | POA: Diagnosis not present

## 2024-02-07 DIAGNOSIS — F17200 Nicotine dependence, unspecified, uncomplicated: Secondary | ICD-10-CM | POA: Insufficient documentation

## 2024-02-07 MED ORDER — TAMSULOSIN HCL 0.4 MG PO CAPS: 0.4000 mg | ORAL_CAPSULE | Freq: Every day | ORAL | 11 refills | Status: AC

## 2024-02-07 MED ORDER — SEMAGLUTIDE (2 MG/DOSE) 8 MG/3ML ~~LOC~~ SOPN: 2.0000 mg | PEN_INJECTOR | SUBCUTANEOUS | 1 refills | Status: AC

## 2024-02-07 MED ORDER — AMLODIPINE BESYLATE 5 MG PO TABS: 5.0000 mg | ORAL_TABLET | Freq: Every day | ORAL | 1 refills | Status: AC

## 2024-02-07 MED ORDER — GABAPENTIN 100 MG PO CAPS: 100.0000 mg | ORAL_CAPSULE | Freq: Three times a day (TID) | ORAL | 0 refills | Status: DC

## 2024-02-07 MED ORDER — CYCLOBENZAPRINE HCL 5 MG PO TABS: 5.0000 mg | ORAL_TABLET | Freq: Three times a day (TID) | ORAL | 2 refills | Status: AC | PRN

## 2024-02-07 MED ORDER — METFORMIN HCL 1000 MG PO TABS: 1000.0000 mg | ORAL_TABLET | Freq: Two times a day (BID) | ORAL | 3 refills | Status: DC

## 2024-02-07 MED ORDER — ATORVASTATIN CALCIUM 20 MG PO TABS: 20.0000 mg | ORAL_TABLET | Freq: Every day | ORAL | 1 refills | Status: AC

## 2024-02-07 NOTE — Progress Notes (Signed)
 "  Virtual Visit via Video Note  I connected with Scott Griffin on 02/07/2024 at  9:00 AM EST by a video enabled telemedicine application and verified that I am speaking with the correct person using two identifiers.  Patient Location: Home Provider Location: Home Office  I discussed the limitations, risks, security, and privacy concerns of performing an evaluation and management service by video and the availability of in person appointments. I also discussed with the patient that there may be a patient responsible charge related to this service. The patient expressed understanding and agreed to proceed.  Subjective: PCP: Edman Meade PEDLAR, FNP  Chief Complaint  Patient presents with   Medical Management of Chronic Issues    4 month follow up    HPI The patient reports blood glucose readings with a highest value of 219 mg/dL and a lowest value of 882 mg/dL. He denies symptoms of polyuria, polydipsia, or polyphagia.  He reports experiencing urinary odor intermittently, without dysuria or burning with urination, and notes dark-colored urine. The last occurrence was three days ago. No other symptoms are reported.   The patient continues to smoke approximately one pack per day, sometimes lasting two day    ROS: Per HPI Current Medications[1]  Observations/Objective: There were no vitals filed for this visit. Physical Exam Patient is well-developed, well-nourished in no acute distress.  Resting comfortably at home.  Head is normocephalic, atraumatic.  No labored breathing.  Speech is clear and coherent with logical content.  Patient is alert and oriented at baseline.   Assessment and Plan: Uncontrolled type 2 diabetes mellitus with hyperglycemia (HCC) Assessment & Plan: The dose of Ozempic  will be increased to 2 mg weekly. The patient was encouraged to continue the current treatment regimen as prescribed. He was also encouraged to decrease his intake of high-sugar foods and  beverages and increase physical activity as tolerated   Orders: -     Hemoglobin A1c -     metFORMIN  HCl; Take 1 tablet (1,000 mg total) by mouth 2 (two) times daily with a meal.  Dispense: 60 tablet; Refill: 3 -     Semaglutide  (2 MG/DOSE); Inject 2 mg as directed once a week.  Dispense: 6 mL; Refill: 1  Acute cystitis without hematuria Assessment & Plan: Pending labs   Current every day smoker Assessment & Plan: Smokes about 1 pack/day  Asked about quitting: confirms that he currently smokes cigarettes Advise to quit smoking: Educated about QUITTING to reduce the risk of cancer, cardio and cerebrovascular disease. Assess willingness: Unwilling to quit at this time, but is working on cutting back. Assist with counseling and pharmacotherapy: Counseled for 5 minutes and literature provided. Arrange for follow up: follow up in 3 months and continue to offer help.   Orders: -     Ambulatory Referral for Lung Cancer Scre  Essential hypertension, benign -     Microalbumin / creatinine urine ratio -     amLODIPine  Besylate; Take 1 tablet (5 mg total) by mouth daily.  Dispense: 90 tablet; Refill: 1  Low back pain due to bilateral sciatica -     Gabapentin ; Take 1 capsule (100 mg total) by mouth 3 (three) times daily.  Dispense: 90 capsule; Refill: 0 -     Cyclobenzaprine  HCl; Take 1 tablet (5 mg total) by mouth 3 (three) times daily as needed for muscle spasms.  Dispense: 30 tablet; Refill: 2  Acute cystitis with hematuria -     Urinalysis -  Urine Culture  Lower urinary tract symptoms (LUTS) -     Tamsulosin  HCl; Take 1 capsule (0.4 mg total) by mouth daily.  Dispense: 30 capsule; Refill: 11  Vitamin D  deficiency -     VITAMIN D  25 Hydroxy (Vit-D Deficiency, Fractures)  TSH (thyroid -stimulating hormone deficiency) -     TSH + free T4  Other hyperlipidemia -     Lipid panel -     CMP14+EGFR -     CBC with Differential/Platelet -     Atorvastatin  Calcium ; Take 1 tablet  (20 mg total) by mouth daily.  Dispense: 90 tablet; Refill: 1  Note: This chart has been completed using Engineer, Civil (consulting) software, and while attempts have been made to ensure accuracy, certain words and phrases may not be transcribed as intended.    Follow Up Instructions: Return in about 5 months (around 07/07/2024).   I discussed the assessment and treatment plan with the patient. The patient was provided an opportunity to ask questions, and all were answered. The patient agreed with the plan and demonstrated an understanding of the instructions.   The patient was advised to call back or seek an in-person evaluation if the symptoms worsen or if the condition fails to improve as anticipated.  The above assessment and management plan was discussed with the patient. The patient verbalized understanding of and has agreed to the management plan.   Terralyn Matsumura  Z Bacchus, FNP     [1]  Current Outpatient Medications:    Accu-Chek FastClix Lancets MISC, USE TO CHECK BLOOD SUGAR FOUR TIMES DAILY AS DIRECTED, Disp: 150 each, Rfl: 3   aspirin 325 MG tablet, Take 325 mg by mouth daily., Disp: , Rfl:    Blood Glucose Monitoring Suppl DEVI, 1 each by Does not apply route in the morning, at noon, and at bedtime. May substitute to any manufacturer covered by patient's insurance., Disp: 1 each, Rfl: 0   Blood Pressure Monitoring (BLOOD PRESSURE MONITOR 3) DEVI, Check blood pressure once daily 1 hour after taking blood pressure medication, Disp: 1 each, Rfl: 0   famotidine (PEPCID) 40 MG tablet, Take 40 mg by mouth every morning., Disp: , Rfl:    glipiZIDE  (GLUCOTROL  XL) 10 MG 24 hr tablet, TAKE ONE TABLET (10 MG TOTAL) BY MOUTH DAILY AT 9AM WITH BREAKFAST, Disp: 60 tablet, Rfl: 11   glucose blood (ACCU-CHEK GUIDE) test strip, USE TO TEST BLOOD SUGAR FOUR TIMES DAILY AS DIRECTED, Disp: 200 strip, Rfl: 1   meloxicam  (MOBIC ) 15 MG tablet, Take 1 tablet (15 mg total) by mouth daily., Disp: 30 tablet, Rfl:  0   naproxen  (NAPROSYN ) 500 MG tablet, TAKE ONE TABLET (500MG  TOTAL) BY MOUTH TWICE DAILY AT 9AM & 5PM WITH MEALS, Disp: 60 tablet, Rfl: 11   olmesartan  (BENICAR ) 20 MG tablet, TAKE ONE TABLET BY MOUTH DAILY AT 9AM, Disp: 30 tablet, Rfl: 11   oxyCODONE -acetaminophen  (PERCOCET/ROXICET) 5-325 MG tablet, Take 1 tablet every 4 (four) hours as needed by mouth., Disp: 15 tablet, Rfl: 0   Semaglutide , 2 MG/DOSE, 8 MG/3ML SOPN, Inject 2 mg as directed once a week., Disp: 6 mL, Rfl: 1   Vitamin D , Ergocalciferol , (DRISDOL ) 1.25 MG (50000 UNIT) CAPS capsule, Take 1 capsule (50,000 Units total) by mouth every 7 (seven) days., Disp: 20 capsule, Rfl: 1   amLODipine  (NORVASC ) 5 MG tablet, Take 1 tablet (5 mg total) by mouth daily., Disp: 90 tablet, Rfl: 1   atorvastatin  (LIPITOR) 20 MG tablet, Take 1 tablet (20  mg total) by mouth daily., Disp: 90 tablet, Rfl: 1   cyclobenzaprine  (FLEXERIL ) 5 MG tablet, Take 1 tablet (5 mg total) by mouth 3 (three) times daily as needed for muscle spasms., Disp: 30 tablet, Rfl: 2   gabapentin  (NEURONTIN ) 100 MG capsule, Take 1 capsule (100 mg total) by mouth 3 (three) times daily., Disp: 90 capsule, Rfl: 0   metFORMIN  (GLUCOPHAGE ) 1000 MG tablet, Take 1 tablet (1,000 mg total) by mouth 2 (two) times daily with a meal., Disp: 60 tablet, Rfl: 3   tamsulosin  (FLOMAX ) 0.4 MG CAPS capsule, Take 1 capsule (0.4 mg total) by mouth daily., Disp: 30 capsule, Rfl: 11  "

## 2024-02-07 NOTE — Assessment & Plan Note (Signed)
Smokes about 1 pack/day  Asked about quitting: confirms that he currently smokes cigarettes Advise to quit smoking: Educated about QUITTING to reduce the risk of cancer, cardio and cerebrovascular disease. Assess willingness: Unwilling to quit at this time, but is working on cutting back. Assist with counseling and pharmacotherapy: Counseled for 5 minutes and literature provided. Arrange for follow up: follow up in 3 months and continue to offer help.  

## 2024-02-07 NOTE — Assessment & Plan Note (Signed)
 Pending labs

## 2024-02-07 NOTE — Assessment & Plan Note (Signed)
 The dose of Ozempic  will be increased to 2 mg weekly. The patient was encouraged to continue the current treatment regimen as prescribed. He was also encouraged to decrease his intake of high-sugar foods and beverages and increase physical activity as tolerated

## 2024-02-14 ENCOUNTER — Other Ambulatory Visit: Payer: Self-pay

## 2024-02-14 DIAGNOSIS — N3000 Acute cystitis without hematuria: Secondary | ICD-10-CM

## 2024-02-14 NOTE — Progress Notes (Addendum)
 Scott Griffin                                          MRN: [id number]   02/14/2024   The VBCI Quality Team Specialist reviewed this patient medical record for the purposes of chart review for care gap closure. The following were reviewed: chart review for care gap closure-glycemic status assessment and kidney health evaluation for diabetes:eGFR  and uACR.    VBCI Quality Team

## 2024-02-15 ENCOUNTER — Telehealth: Payer: Self-pay | Admitting: *Deleted

## 2024-02-15 ENCOUNTER — Other Ambulatory Visit: Payer: Self-pay | Admitting: *Deleted

## 2024-02-15 DIAGNOSIS — Z122 Encounter for screening for malignant neoplasm of respiratory organs: Secondary | ICD-10-CM

## 2024-02-15 DIAGNOSIS — F17210 Nicotine dependence, cigarettes, uncomplicated: Secondary | ICD-10-CM

## 2024-02-15 DIAGNOSIS — Z87891 Personal history of nicotine dependence: Secondary | ICD-10-CM

## 2024-02-15 LAB — CBC WITH DIFFERENTIAL/PLATELET
Basophils Absolute: 0.1 x10E3/uL (ref 0.0–0.2)
Basos: 1 %
EOS (ABSOLUTE): 0.1 x10E3/uL (ref 0.0–0.4)
Eos: 1 %
Hematocrit: 43.3 % (ref 37.5–51.0)
Hemoglobin: 14.4 g/dL (ref 13.0–17.7)
Immature Grans (Abs): 0 x10E3/uL (ref 0.0–0.1)
Immature Granulocytes: 0 %
Lymphocytes Absolute: 2.7 x10E3/uL (ref 0.7–3.1)
Lymphs: 26 %
MCH: 28.5 pg (ref 26.6–33.0)
MCHC: 33.3 g/dL (ref 31.5–35.7)
MCV: 86 fL (ref 79–97)
Monocytes Absolute: 0.8 x10E3/uL (ref 0.1–0.9)
Monocytes: 8 %
Neutrophils Absolute: 6.7 x10E3/uL (ref 1.4–7.0)
Neutrophils: 64 %
Platelets: 211 x10E3/uL (ref 150–450)
RBC: 5.05 x10E6/uL (ref 4.14–5.80)
RDW: 13.3 % (ref 11.6–15.4)
WBC: 10.4 x10E3/uL (ref 3.4–10.8)

## 2024-02-15 LAB — LIPID PANEL
Chol/HDL Ratio: 3.4 ratio (ref 0.0–5.0)
Cholesterol, Total: 118 mg/dL (ref 100–199)
HDL: 35 mg/dL — ABNORMAL LOW
LDL Chol Calc (NIH): 54 mg/dL (ref 0–99)
Triglycerides: 170 mg/dL — ABNORMAL HIGH (ref 0–149)
VLDL Cholesterol Cal: 29 mg/dL (ref 5–40)

## 2024-02-15 LAB — CMP14+EGFR
ALT: 10 IU/L (ref 0–44)
AST: 12 IU/L (ref 0–40)
Albumin: 4.5 g/dL (ref 3.9–4.9)
Alkaline Phosphatase: 93 IU/L (ref 47–123)
BUN/Creatinine Ratio: 18 (ref 10–24)
BUN: 21 mg/dL (ref 8–27)
Bilirubin Total: 0.3 mg/dL (ref 0.0–1.2)
CO2: 17 mmol/L — ABNORMAL LOW (ref 20–29)
Calcium: 9.7 mg/dL (ref 8.6–10.2)
Chloride: 99 mmol/L (ref 96–106)
Creatinine, Ser: 1.17 mg/dL (ref 0.76–1.27)
Globulin, Total: 2.3 g/dL (ref 1.5–4.5)
Glucose: 265 mg/dL — ABNORMAL HIGH (ref 70–99)
Potassium: 5.3 mmol/L — ABNORMAL HIGH (ref 3.5–5.2)
Sodium: 134 mmol/L (ref 134–144)
Total Protein: 6.8 g/dL (ref 6.0–8.5)
eGFR: 69 mL/min/1.73

## 2024-02-15 LAB — TSH+FREE T4
Free T4: 1.31 ng/dL (ref 0.82–1.77)
TSH: 1.53 u[IU]/mL (ref 0.450–4.500)

## 2024-02-15 LAB — HEMOGLOBIN A1C
Est. average glucose Bld gHb Est-mCnc: 272 mg/dL
Hgb A1c MFr Bld: 11.1 % — ABNORMAL HIGH (ref 4.8–5.6)

## 2024-02-15 LAB — VITAMIN D 25 HYDROXY (VIT D DEFICIENCY, FRACTURES): Vit D, 25-Hydroxy: 21.9 ng/mL — ABNORMAL LOW (ref 30.0–100.0)

## 2024-02-15 NOTE — Telephone Encounter (Signed)
 Lung Cancer Screening Narrative/Criteria Questionnaire (Cigarette Smokers Only- No Cigars/Pipes/vapes)   Scott Griffin   SDMV:03/01/24 3:00-  [name]                                           [birth year]              LDCT: 03/01/24 6:00- AP    63 y.o.   Phone: [phone]  Lung Screening Narrative (confirm age 66-77 yrs Medicare / 50-80 yrs Private pay insurance)   Insurance information:[employer]   Referring Provider:BAcchus   This screening involves an initial phone call with a team member from our program. It is called a shared decision making visit. The initial meeting is required by insurance and Medicare to make sure you understand the program. This appointment takes about 15-20 minutes to complete. The CT scan will completed at a separate date/time. This scan takes about 5-10 minutes to complete and you may eat and drink before and after the scan.  Criteria questions for Lung Cancer Screening:   Are you a current or former smoker? Current Age began smoking: 13   If you are a former smoker, what year did you quit smoking? (within 15 yrs)   To calculate your smoking history, I need an accurate estimate of how many packs of cigarettes you smoked per day and for how many years. (Not just the number of PPD you are now smoking)   Years smoking 50 x Packs per day 1 = Pack years 50   (at least 20 pack yrs)   (Make sure they understand that we need to know how much they have smoked in the past, not just the number of PPD they are smoking now)  Do you have a personal history of cancer?  No    Do you have a family history of cancer? Yes  (cancer type and and relative) Uncle (prostate)  Are you coughing up blood?  No  Have you had unexplained weight loss of 15 lbs or more in the last 6 months? No  It looks like you meet all criteria.     Additional information: N/A

## 2024-02-17 LAB — UA/M W/RFLX CULTURE, ROUTINE
Bilirubin, UA: NEGATIVE
Nitrite, UA: POSITIVE — AB
RBC, UA: NEGATIVE

## 2024-02-17 LAB — MICROSCOPIC EXAMINATION: RBC, Urine: NONE SEEN /HPF (ref 0–2)

## 2024-02-19 LAB — MICROSCOPIC EXAMINATION
Casts: NONE SEEN /LPF
Epithelial Cells (non renal): NONE SEEN /HPF (ref 0–10)

## 2024-02-19 LAB — UA/M W/RFLX CULTURE, ROUTINE
Ketones, UA: NEGATIVE
Urobilinogen, Ur: 1 mg/dL (ref 0.2–1.0)
pH, UA: 5 (ref 5.0–7.5)

## 2024-02-19 LAB — URINE CULTURE, REFLEX

## 2024-02-21 ENCOUNTER — Telehealth (INDEPENDENT_AMBULATORY_CARE_PROVIDER_SITE_OTHER): Admitting: Family Medicine

## 2024-02-21 ENCOUNTER — Other Ambulatory Visit: Payer: Self-pay | Admitting: Family Medicine

## 2024-02-21 DIAGNOSIS — E1165 Type 2 diabetes mellitus with hyperglycemia: Secondary | ICD-10-CM | POA: Diagnosis not present

## 2024-02-21 DIAGNOSIS — R112 Nausea with vomiting, unspecified: Secondary | ICD-10-CM | POA: Diagnosis not present

## 2024-02-21 MED ORDER — PROMETHAZINE HCL 25 MG PO TABS: 25.0000 mg | ORAL_TABLET | Freq: Three times a day (TID) | ORAL | 0 refills | Status: AC | PRN

## 2024-02-21 NOTE — Addendum Note (Signed)
 Addended by: EDMAN MEADE PEDLAR on: 02/21/2024 01:47 PM   Modules accepted: Orders

## 2024-02-21 NOTE — Assessment & Plan Note (Signed)
 Encouraged close monitoring of his glucose levels Recommend staying well hydrated with water or sugar free fluids Encouraged rest or avoidance of strenuous activities Encouraged to seek urgent care in the ER

## 2024-02-21 NOTE — Progress Notes (Signed)
 "  Virtual Visit via Video Note  I connected with Scott Griffin on 02/21/2024 at  1:20 PM EST by a video enabled telemedicine application and verified that I am speaking with the correct person using two identifiers.  Patient Location: Home Provider Location: Home Office  I discussed the limitations, risks, security, and privacy concerns of performing an evaluation and management service by video and the availability of in person appointments. I also discussed with the patient that there may be a patient responsible charge related to this service. The patient expressed understanding and agreed to proceed.  Subjective: PCP: Edman Meade PEDLAR, FNP  Chief Complaint  Patient presents with   Emesis    Vomiting , dizzy for three days   HPI  The patient has a history of type 2 diabetes and presents with symptoms that began last Wednesday. He reports decreased appetite, mild headaches, dizziness, increased fatigue, nausea, increased urination, and increased thirst. Home glucose readings have ranged from a high of 180 mg/dL to a low of 889 mg/dL. He denies abdominal pain or fruity breath. He has been using over-the-counter nausea medications such as Tums with minimum relief. 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 He denies any systemic symptoms at this time.   ROS: Per HPI Current Medications[1]  Observations/Objective: There were no vitals filed for this visit. Physical Exam Patient is well-developed, well-nourished in no acute distress.  Resting comfortably at home.  Head is normocephalic, atraumatic.  No labored breathing.  Speech is clear and coherent with logical content.  Patient is alert and oriented at baseline.   Assessment and Plan: Uncontrolled type 2 diabetes mellitus with hyperglycemia (HCC) Assessment & Plan: Encouraged close monitoring of his glucose levels Recommend staying  well hydrated with water or sugar free fluids Encouraged rest or avoidance of strenuous activities Encouraged to seek urgent care in the ER     Note: This chart has been completed using [employer] software, and while attempts have been made to ensure accuracy, certain words and phrases may not be transcribed as intended.    Follow Up Instructions: No follow-ups on file.   I discussed the assessment and treatment plan with the patient. The patient was provided an opportunity to ask questions, and all were answered. The patient agreed with the plan and demonstrated an understanding of the instructions.   The patient was advised to call back or seek an in-person evaluation if the symptoms worsen or if the condition fails to improve as anticipated.  The above assessment and management plan was discussed with the patient. The patient verbalized understanding of and has agreed to the management plan.   Riki Gehring  Z Bacchus, FNP     [1]  Current Outpatient Medications:    Accu-Chek FastClix Lancets MISC, USE TO CHECK BLOOD SUGAR FOUR TIMES DAILY AS DIRECTED, Disp: 150 each, Rfl: 3   amLODipine  (NORVASC ) 5 MG tablet, Take 1 tablet (5 mg total) by mouth daily., Disp: 90 tablet, Rfl: 1   aspirin 325 MG tablet, Take 325 mg by mouth daily., Disp: , Rfl:    atorvastatin  (LIPITOR) 20 MG tablet, Take 1 tablet (20 mg total) by mouth daily., Disp: 90 tablet, Rfl: 1   Blood Glucose Monitoring Suppl DEVI, 1 each by Does not apply route in the morning, at noon, and at bedtime. May substitute to any manufacturer covered by patient's insurance., Disp: 1 each, Rfl: 0  Blood Pressure Monitoring (BLOOD PRESSURE MONITOR 3) DEVI, Check blood pressure once daily 1 hour after taking blood pressure medication, Disp: 1 each, Rfl: 0   cyclobenzaprine  (FLEXERIL ) 5 MG tablet, Take 1 tablet (5 mg total) by mouth 3 (three) times daily as needed for muscle spasms., Disp: 30 tablet, Rfl: 2   famotidine (PEPCID)  40 MG tablet, Take 40 mg by mouth every morning., Disp: , Rfl:    gabapentin  (NEURONTIN ) 100 MG capsule, Take 1 capsule (100 mg total) by mouth 3 (three) times daily., Disp: 90 capsule, Rfl: 0   glipiZIDE  (GLUCOTROL  XL) 10 MG 24 hr tablet, TAKE ONE TABLET (10 MG TOTAL) BY MOUTH DAILY AT 9AM WITH BREAKFAST, Disp: 60 tablet, Rfl: 11   glucose blood (ACCU-CHEK GUIDE) test strip, USE TO TEST BLOOD SUGAR FOUR TIMES DAILY AS DIRECTED, Disp: 200 strip, Rfl: 1   meloxicam  (MOBIC ) 15 MG tablet, Take 1 tablet (15 mg total) by mouth daily., Disp: 30 tablet, Rfl: 0   metFORMIN  (GLUCOPHAGE ) 1000 MG tablet, Take 1 tablet (1,000 mg total) by mouth 2 (two) times daily with a meal., Disp: 60 tablet, Rfl: 3   naproxen  (NAPROSYN ) 500 MG tablet, TAKE ONE TABLET (500MG  TOTAL) BY MOUTH TWICE DAILY AT 9AM & 5PM WITH MEALS, Disp: 60 tablet, Rfl: 11   olmesartan  (BENICAR ) 20 MG tablet, TAKE ONE TABLET BY MOUTH DAILY AT 9AM, Disp: 30 tablet, Rfl: 11   oxyCODONE -acetaminophen  (PERCOCET/ROXICET) 5-325 MG tablet, Take 1 tablet every 4 (four) hours as needed by mouth., Disp: 15 tablet, Rfl: 0   Semaglutide , 2 MG/DOSE, 8 MG/3ML SOPN, Inject 2 mg as directed once a week., Disp: 6 mL, Rfl: 1   tamsulosin  (FLOMAX ) 0.4 MG CAPS capsule, Take 1 capsule (0.4 mg total) by mouth daily., Disp: 30 capsule, Rfl: 11   Vitamin D , Ergocalciferol , (DRISDOL ) 1.25 MG (50000 UNIT) CAPS capsule, Take 1 capsule (50,000 Units total) by mouth every 7 (seven) days., Disp: 20 capsule, Rfl: 1  "

## 2024-02-23 ENCOUNTER — Other Ambulatory Visit: Payer: Self-pay | Admitting: Family Medicine

## 2024-02-23 DIAGNOSIS — E1165 Type 2 diabetes mellitus with hyperglycemia: Secondary | ICD-10-CM

## 2024-02-26 ENCOUNTER — Other Ambulatory Visit: Payer: Self-pay | Admitting: Family Medicine

## 2024-02-26 ENCOUNTER — Ambulatory Visit: Payer: Self-pay | Admitting: Family Medicine

## 2024-02-26 DIAGNOSIS — E1165 Type 2 diabetes mellitus with hyperglycemia: Secondary | ICD-10-CM

## 2024-02-26 DIAGNOSIS — N3090 Cystitis, unspecified without hematuria: Secondary | ICD-10-CM

## 2024-02-26 MED ORDER — SULFAMETHOXAZOLE-TRIMETHOPRIM 800-160 MG PO TABS: 1.0000 | ORAL_TABLET | Freq: Two times a day (BID) | ORAL | 0 refills | Status: AC

## 2024-02-26 NOTE — Progress Notes (Signed)
 Please inform the patient that a referral has been placed to Endocrinology for collaborative care in managing his diabetes. A referral has also been placed to Pharmacy and  for diabetes education. Please encourage him to continue his current treatment regimen, including Ozempic  2 mg weekly, glipizide , and metformin , along with reducing intake of high-sugar foods and beverages and increasing physical activity as tolerated

## 2024-02-28 ENCOUNTER — Telehealth: Payer: Self-pay

## 2024-02-28 NOTE — Progress Notes (Signed)
 Care Guide Pharmacy Note  02/28/2024 Name: Scott Griffin MRN: [id number] DOB: 1960/06/08  Referred By: Edman Meade PEDLAR, FNP Reason for referral: Complex Care Management (Outreach to schedule with pharm d )   Scott Griffin is a 64 y.o. year old male who is a primary care patient of Bacchus, Meade PEDLAR, FNP.  Scott Griffin was referred to the pharmacist for assistance related to: DMII  An unsuccessful telephone outreach was attempted today to contact the patient who was referred to the pharmacy team for assistance with medication management. Additional attempts will be made to contact the patient.  Jeoffrey Buffalo , RMA     [facility]  [facility], [facility] Guide  Direct Dial: [phone]  Website: [email or web address]

## 2024-02-28 NOTE — Progress Notes (Signed)
 Care Guide Pharmacy Note  02/28/2024 Name: Scott Griffin MRN: [id number] DOB: 1960-11-06  Referred By: Edman Meade PEDLAR, FNP Reason for referral: Complex Care Management (Outreach to schedule with pharm d )   Scott Griffin is a 64 y.o. year old male who is a primary care patient of Bacchus, Meade PEDLAR, FNP.  Scott Griffin was referred to the pharmacist for assistance related to: DMII  Successful contact was made with the patient to discuss pharmacy services including being ready for the pharmacist to call at least 5 minutes before the scheduled appointment time and to have medication bottles and any blood pressure readings ready for review. The patient agreed to meet with the pharmacist via telephone visit on (date/time).03/02/2024  Scott Griffin , RMA     [place]  [facility], [facility] Guide  Direct Dial: [phone]  Website: [email or web address]

## 2024-03-01 ENCOUNTER — Ambulatory Visit (HOSPITAL_COMMUNITY)
Admission: RE | Admit: 2024-03-01 | Discharge: 2024-03-01 | Disposition: A | Discharge status: Home / Self Care | Source: Ambulatory Visit | Attending: Acute Care | Admitting: Acute Care

## 2024-03-01 ENCOUNTER — Encounter

## 2024-03-01 DIAGNOSIS — Z122 Encounter for screening for malignant neoplasm of respiratory organs: Secondary | ICD-10-CM | POA: Insufficient documentation

## 2024-03-01 DIAGNOSIS — Z87891 Personal history of nicotine dependence: Secondary | ICD-10-CM | POA: Diagnosis present

## 2024-03-01 DIAGNOSIS — F17210 Nicotine dependence, cigarettes, uncomplicated: Secondary | ICD-10-CM | POA: Insufficient documentation

## 2024-03-01 DIAGNOSIS — Z720 Tobacco use: Secondary | ICD-10-CM

## 2024-03-01 NOTE — Patient Instructions (Signed)
 Thank you for participating in the [facility] Lung Cancer Screening Program. It was our pleasure to meet you today. We will call you with the results of your scan within the next few days. Your scan will be assigned a Lung RADS category score by the physicians reading the scans.  This Lung RADS score determines follow up scanning.  See below for description of categories, and follow up screening recommendations. We will be in touch to schedule your follow up screening annually or based on recommendations of our providers. We will fax a copy of your scan results to your Primary Care Physician, or the physician who referred you to the program, to ensure they have the results. Please call the office if you have any questions or concerns regarding your scanning experience or results.  Our office number is [phone]. Please speak with Karna Curly, RN., Karna Doom RN, or [facility] RN, and Isaiah Dover RN. They are  our Lung Cancer Screening RN.'s If They are unavailable when you call, Please leave a message on the voice mail. We will return your call at our earliest convenience.This voice mail is monitored several times a day.  Remember, if your scan is normal, we will scan you annually as long as you continue to meet the criteria for the program. (Age 53-80, Current smoker or smoker who has quit within the last 15 years). If you are a smoker, remember, quitting is the single most powerful action that you can take to decrease your risk of lung cancer and other pulmonary, breathing related problems. We know quitting is hard, and we are here to help.  Please let us  know if there is anything we can do to help you meet your goal of quitting. If you are a former smoker, counselling psychologist. We are proud of you! Remain smoke free! Remember you can refer friends or family members through the number above.  We will screen them to make sure they meet criteria for the program. Thank you for helping us   take better care of you by participating in Lung Screening.  For Virtual Smoking Cessation Classes , The American Lung Association Provides  Freedom From Smoking Classes.  Please search their website for dates and times.    Lung RADS Categories:  Lung RADS 1: no nodules or definitely non-concerning nodules.  Recommendation is for a repeat annual scan in 12 months.  Lung RADS 2:  nodules that are non-concerning in appearance and behavior with a very low likelihood of becoming an active cancer. Recommendation is for a repeat annual scan in 12 months.  Lung RADS 3: nodules that are probably non-concerning , includes nodules with a low likelihood of becoming an active cancer.  Recommendation is for a 64-month repeat screening scan. Often noted after an upper respiratory illness. We will be in touch to make sure you have no questions, and to schedule your 4-month scan.  Lung RADS 4 A: nodules with concerning findings, recommendation is most often for a follow up scan in 3 months or additional testing based on our provider's assessment of the scan. We will be in touch to make sure you have no questions and to schedule the recommended 3 month follow up scan.  Lung RADS 4 B:  indicates findings that are concerning. We will be in touch with you to schedule additional diagnostic testing based on our provider's  assessment of the scan.  Smoking Cessation  Tips to Quit:  Pick a Quit Day within the next week. You  can also try Reduce to Quit strategy like we discussed.  Remove temptations: toss cigarettes, lighters, ashtrays.  Tell someone you trust for support.  Avoid triggers like stress, boredom, or being around smokers.  Use healthy replacements: water, gum, walking, deep breaths.  Stay busy with hobbies, music, drawing, or exercise.  Be patient with yourself--slipping once doesnt mean failure.   [place] Smoking/Nicotine Cessation Resources  If youre ready to quit TODAY, our virtual care  team is available to start your journey to a tobacco free life.  Appointments are available from 8 a.m. to 8 p.m. Monday to Friday. Most health insurance plans will cover some level of tobacco cessation visits and medications.  To make a virtual appointment and access other resources, follow the link: severties.nl   *[employer]uit[facility] (1-800-QUITNOW) [employer]uit[facility] offers free combination nicotine replacement therapy (patches plus either gum or lozenges). When used along with counseling, this will more than double your chances of quitting successfully.  Call 1-800-QUIT NOW or Text: Ready to 34191.   Spanish language portal: 1-855-DEJELO-YA 636-477-8285).  TTY: 548-110-4110 [place] Quitline: Call 888-7AI-QUIT 262-429-4819) http://carroll-castaneda.info/  The American Lung Association offers Freedom From Smoking Programs Self guided or group programs offered, check their website for free virtual programs available to [facility] residents Lung Helpline at 1-800-LUNGUSA  http://keith.biz/  *[employer] Offers tools and tips to quit smoking.  Free quitSTART app:   Monitor progress, manage cravings, access tools, and more with the app.  portablegrid.se  Hypnosis for smoking cessation  [employer] 902-602-6268  Acupuncture for smoking cessation  [employer] [facility] [phone]    Please let us  know how we can support you as you quit smoking. I know this is hard, but you can do this!    Your CT scan is scheduled for 03/01/2024 at 6 pm at [facility].

## 2024-03-01 NOTE — Progress Notes (Signed)
 Virtual Visit via Telephone Note  I connected with Scott Griffin on 03/01/2024 at  3:00 PM EST by telephone and verified that I am speaking with the correct person using two identifiers.  Location: Patient: At home, in [place]  Provider: [address], [place], [place], [address]    I discussed the limitations, risks, security and privacy concerns of performing an evaluation and management service by telephone and the availability of in person appointments. I also discussed with the patient that there may be a patient responsible charge related to this service. The patient expressed understanding and agreed to proceed.  Shared Decision Making Visit Lung Cancer Screening Program [phone])   Eligibility: Age 64 y.o. Pack Years Smoking History Calculation 50 pack years  (# packs/per year x # years smoked) Recent History of coughing up blood  no Unexplained weight loss? no ( >Than 15 pounds within the last 6 months ) Prior History Lung / other cancer no (Diagnosis within the last 5 years already requiring surveillance chest CT Scans). Smoking Status Current Smoker Former Smokers: Years since quit: N/A  Quit Date: N/A  Visit Components: Discussion included one or more decision making aids. yes Discussion included risk/benefits of screening. yes Discussion included potential follow up diagnostic testing for abnormal scans. yes Discussion included meaning and risk of over diagnosis. yes Discussion included meaning and risk of False Positives. yes Discussion included meaning of total radiation exposure. yes  Counseling Included: Importance of adherence to annual lung cancer LDCT screening. yes Impact of comorbidities on ability to participate in the program. yes Ability and willingness to under diagnostic treatment. yes  Smoking Cessation Counseling: Current Smokers:  Discussed importance of smoking cessation. yes Information about tobacco cessation classes and interventions  provided to patient. yes Patient provided with ticket for LDCT Scan. N/A Symptomatic Patient. yes  Counseling(Intermediate counseling: > three minutes) 99406 Diagnosis Code: Tobacco Use Z72.0 Asymptomatic Patient no  Counseling N/A Former Smokers:  Discussed the importance of maintaining cigarette abstinence. N/A Diagnosis Code: Personal History of Nicotine Dependence. S12.108 Information about tobacco cessation classes and interventions provided to patient. Yes Patient provided with ticket for LDCT Scan. N/A Written Order for Lung Cancer Screening with LDCT placed in Epic. Yes (CT Chest Lung Cancer Screening Low Dose W/O CM) [id number] Z12.2-Screening of respiratory organs Z87.891-Personal history of nicotine dependence  Scott Griffin is a current user of tobacco or nicotine products. He is ready to quit at this time. Discussed calling the 1-800QUITNOW number for counseling and nicotine replacement therapy. Patient states he will call them today and try nicotine replacement with behavioral strategies. If the nicotine patches don't work, he states he will follow up with his PCP in a few months to discuss other methods including Chantix/Wellbutrin.  Counseling provided today addressed the risks of continued use and the benefits of cessation. Discussed tobacco/nicotine use history, readiness to quit, and evidence-based treatment options including behavioral strategies, support resources, and pharmacologic therapies. Provided encouragement and educational materials on steps and resources to quit smoking. Patient questions were addressed, and follow-up recommended for continued support. Total time spent on counseling: 6 minutes.   Scott CHRISTELLA [place], FNP

## 2024-03-02 ENCOUNTER — Other Ambulatory Visit: Payer: Self-pay

## 2024-03-02 NOTE — Progress Notes (Unsigned)
" ° °  03/03/2024 Name: Scott Griffin MRN: [id number] DOB: 08-12-1960  No chief complaint on file.   Scott Griffin is a 64 y.o. year old male who presented for a telephone visit.   They were referred to the pharmacist by their PCP for assistance in managing medication adherence.    Subjective:  Care Team: Primary Care Provider: Edman Meade PEDLAR, FNP ; Next Scheduled Visit:  Future Appointments  Date Time Provider Department Center  05/30/2024  9:00 AM Therisa Benton PARAS, NP [facility] None  07/19/2024  9:20 AM Gladis Mustard, FNP [facility] [address]  11/20/2024  3:10 PM RPC-ANNUAL WELLNESS VISIT [facility] [address]     Medication Access/Adherence  Current Pharmacy:  [facility] [place], [place] - [id number] [place] [address] Phone: [phone] Fax: [phone]  [facility] - [place], [place] - [address] [place] [address] Phone: [phone] Fax: [phone]   Patient reports affordability concerns with their medications: not at this time. Patient reports access/transportation concerns to their pharmacy: Yes  Patient reports adherence concerns with their medications:  Previously, however has been able to fill medications on time past few months.  Denies any issues with adherence or cost of medications.   Referred to pharmacist for help with medication adherence   Objective:  Lab Results  Component Value Date   HGBA1C 11.1 (H) 02/14/2024    Lab Results  Component Value Date   CREATININE 1.17 02/14/2024   BUN 21 02/14/2024   NA 134 02/14/2024   K 5.3 (H) 02/14/2024   CL 99 02/14/2024   CO2 17 (L) 02/14/2024    Lab Results  Component Value Date   CHOL 118 02/14/2024   HDL 35 (L) 02/14/2024   LDLCALC 54 02/14/2024   TRIG 170 (H) 02/14/2024   CHOLHDL 3.4 02/14/2024     Assessment/Plan:   ***  Follow Up Plan: ***  ***      Blood sugar fluctuates at home -  110-130  Future Appointments  Date Time Provider Department Center  05/30/2024  9:00 AM Therisa Benton PARAS, NP [facility] None  07/19/2024  9:20 AM Gladis Mustard, FNP [facility] [address]  11/20/2024  3:10 PM RPC-ANNUAL WELLNESS VISIT [facility] [address]    Breaded chicken,   [place] carrot celerry   [place] - cranberry jucie   "

## 2024-03-06 ENCOUNTER — Other Ambulatory Visit: Payer: Self-pay | Admitting: Family Medicine

## 2024-03-06 DIAGNOSIS — M5441 Lumbago with sciatica, right side: Secondary | ICD-10-CM

## 2024-03-10 ENCOUNTER — Other Ambulatory Visit: Payer: Self-pay

## 2024-03-10 DIAGNOSIS — Z122 Encounter for screening for malignant neoplasm of respiratory organs: Secondary | ICD-10-CM

## 2024-03-10 DIAGNOSIS — Z87891 Personal history of nicotine dependence: Secondary | ICD-10-CM

## 2024-03-10 DIAGNOSIS — F17210 Nicotine dependence, cigarettes, uncomplicated: Secondary | ICD-10-CM

## 2024-05-30 ENCOUNTER — Ambulatory Visit: Admitting: Nurse Practitioner

## 2024-07-19 ENCOUNTER — Ambulatory Visit: Payer: Self-pay | Admitting: Nurse Practitioner

## 2024-11-20 ENCOUNTER — Ambulatory Visit
